# Patient Record
Sex: Female | Born: 1990 | Race: White | Hispanic: No | Marital: Single | Smoking: Never smoker
Health system: Southern US, Community
[De-identification: ages and names within clinical notes are randomized; demographics above are authoritative.]

## PROBLEM LIST (undated history)

## (undated) DIAGNOSIS — F419 Anxiety disorder, unspecified: Secondary | ICD-10-CM

## (undated) DIAGNOSIS — N946 Dysmenorrhea, unspecified: Secondary | ICD-10-CM

## (undated) DIAGNOSIS — R51 Headache: Secondary | ICD-10-CM

## (undated) DIAGNOSIS — N390 Urinary tract infection, site not specified: Secondary | ICD-10-CM

## (undated) DIAGNOSIS — R519 Headache, unspecified: Secondary | ICD-10-CM

## (undated) HISTORY — DX: Dysmenorrhea, unspecified: N94.6

## (undated) HISTORY — DX: Headache: R51

## (undated) HISTORY — PX: OTHER SURGICAL HISTORY: SHX169

## (undated) HISTORY — DX: Urinary tract infection, site not specified: N39.0

## (undated) HISTORY — DX: Anxiety disorder, unspecified: F41.9

## (undated) HISTORY — DX: Headache, unspecified: R51.9

---
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
## 2009-02-18 ENCOUNTER — Encounter: Payer: Self-pay | Admitting: Internal Medicine

## 2009-04-05 ENCOUNTER — Encounter: Payer: Self-pay | Admitting: Internal Medicine

## 2009-05-09 ENCOUNTER — Ambulatory Visit: Payer: Self-pay | Admitting: Family Medicine

## 2009-05-09 DIAGNOSIS — R300 Dysuria: Secondary | ICD-10-CM | POA: Insufficient documentation

## 2009-05-09 DIAGNOSIS — R21 Rash and other nonspecific skin eruption: Secondary | ICD-10-CM | POA: Insufficient documentation

## 2009-05-09 LAB — CONVERTED CEMR LAB
Bilirubin Urine: NEGATIVE
Glucose, Urine, Semiquant: NEGATIVE
Ketones, urine, test strip: NEGATIVE
Specific Gravity, Urine: 1.02
pH: 5.5

## 2009-06-04 ENCOUNTER — Ambulatory Visit: Payer: Self-pay | Admitting: Internal Medicine

## 2009-06-04 DIAGNOSIS — L908 Other atrophic disorders of skin: Secondary | ICD-10-CM | POA: Insufficient documentation

## 2009-06-04 DIAGNOSIS — L918 Other hypertrophic disorders of the skin: Secondary | ICD-10-CM

## 2009-07-04 ENCOUNTER — Encounter (INDEPENDENT_AMBULATORY_CARE_PROVIDER_SITE_OTHER): Payer: Self-pay | Admitting: *Deleted

## 2009-08-07 ENCOUNTER — Telehealth: Payer: Self-pay | Admitting: *Deleted

## 2009-08-16 ENCOUNTER — Ambulatory Visit: Payer: Self-pay | Admitting: Internal Medicine

## 2009-08-16 DIAGNOSIS — R599 Enlarged lymph nodes, unspecified: Secondary | ICD-10-CM | POA: Insufficient documentation

## 2009-08-16 DIAGNOSIS — L0889 Other specified local infections of the skin and subcutaneous tissue: Secondary | ICD-10-CM | POA: Insufficient documentation

## 2009-08-16 LAB — CONVERTED CEMR LAB
ALT: 15 units/L (ref 0–35)
AST: 21 units/L (ref 0–37)
Albumin: 4.1 g/dL (ref 3.5–5.2)
Alkaline Phosphatase: 48 units/L (ref 39–117)
Basophils Absolute: 0 10*3/uL (ref 0.0–0.1)
Calcium: 9.3 mg/dL (ref 8.4–10.5)
Cholesterol: 163 mg/dL (ref 0–200)
EBV NA IgG: 0.85
EBV VCA IgM: 0.11
Eosinophils Relative: 0.3 % (ref 0.0–5.0)
HDL: 64 mg/dL (ref >39)
Heterophile Ab Screen: NEGATIVE
MCV: 94.5 fL (ref 78.0–100.0)
Mono Screen: NEGATIVE
Monocytes Absolute: 1.3 10*3/uL — ABNORMAL HIGH (ref 0.1–1.0)
Monocytes Relative: 14.3 % — ABNORMAL HIGH (ref 3.0–12.0)
Neutrophils Relative %: 64.4 % (ref 43.0–77.0)
Platelets: 283 10*3/uL (ref 150.0–400.0)
RDW: 12.8 % (ref 11.5–14.6)
Rapid Strep: NEGATIVE
Sodium: 140 meq/L (ref 135–145)
TSH: 1.46 microintl units/mL (ref 0.35–5.50)
Total CHOL/HDL Ratio: 2.5
WBC: 9.3 10*3/uL (ref 4.5–10.5)

## 2009-08-17 ENCOUNTER — Encounter: Payer: Self-pay | Admitting: Internal Medicine

## 2009-08-19 ENCOUNTER — Telehealth: Payer: Self-pay | Admitting: *Deleted

## 2009-08-21 ENCOUNTER — Telehealth: Payer: Self-pay | Admitting: *Deleted

## 2009-08-22 ENCOUNTER — Ambulatory Visit: Payer: Self-pay | Admitting: Internal Medicine

## 2009-08-22 DIAGNOSIS — K121 Other forms of stomatitis: Secondary | ICD-10-CM | POA: Insufficient documentation

## 2009-08-22 DIAGNOSIS — K1230 Oral mucositis (ulcerative), unspecified: Secondary | ICD-10-CM | POA: Insufficient documentation

## 2010-07-01 ENCOUNTER — Telehealth: Payer: Self-pay | Admitting: Internal Medicine

## 2010-07-24 ENCOUNTER — Ambulatory Visit: Payer: Self-pay | Admitting: Family Medicine

## 2010-07-24 DIAGNOSIS — J029 Acute pharyngitis, unspecified: Secondary | ICD-10-CM | POA: Insufficient documentation

## 2010-07-24 LAB — CONVERTED CEMR LAB: Rapid Strep: POSITIVE

## 2010-09-03 ENCOUNTER — Other Ambulatory Visit: Payer: Self-pay | Admitting: *Deleted

## 2010-09-03 DIAGNOSIS — Z309 Encounter for contraceptive management, unspecified: Secondary | ICD-10-CM

## 2010-09-03 MED ORDER — NORETHIN ACE-ETH ESTRAD-FE 1-20 MG-MCG PO TABS: 1.0000 | ORAL_TABLET | Freq: Every day | ORAL | Status: DC

## 2010-09-03 NOTE — Telephone Encounter (Signed)
Rx sent to pharmacy via fax. Sent letter to pt to call back to make a follow up appt. Before next refill

## 2010-09-04 NOTE — Progress Notes (Signed)
Summary: REFILL ON OCPS  Phone Note From Pharmacy   Caller: [facility]* Reason for Call: Needs renewal Details for Reason: Loestrin  Initial call taken by: Romualdo Bolk, CMA (AAMA),  August 07, 2009 1:48 PM    Prescriptions: LOESTRIN 24 FE 1-20 MG-MCG TABS (NORETHIN ACE-ETH ESTRAD-FE) 1 by mouth once daily  #1 x 0   Entered by:   Romualdo Bolk, CMA (AAMA)   Authorized by:   Madelin Headings MD   Signed by:   Romualdo Bolk, CMA (AAMA) on 08/07/2009   Method used:   Electronically to        [facility]* (retail)       [address] [place], [place]  [address]       Ph: [phone] or [phone]       Fax: [phone]   RxID:   [id number]

## 2010-09-04 NOTE — Assessment & Plan Note (Signed)
Summary: knot on side of throat//-? pap//ccm   Vital Signs:  Patient profile:   20 year old female Menstrual status:  regular LMP:     08/09/2009 Weight:      105 pounds Temp:     98.3 degrees F oral Pulse rate:   72 / minute BP sitting:   120 / 80  (right arm) Cuff size:   regular  Vitals Entered By: Romualdo Bolk, CMA (AAMA) (August 16, 2009 8:39 AM) CC: Pt wants to change her ocps and get a refill due to cost. Pt also has a cold, coughing and congestion x 3 weeks and knots on the left side of her neck x 2 days. Pt states that her left ear is sore and itching.  LMP (date): 08/09/2009 LMP - Character: normal Menarche (age onset years): unsure   Menses interval (days): 28 Menstrual flow (days): 4-5 Enter LMP: 08/09/2009 Last PAP Result NEGATIVE FOR INTRAEPITHELIAL LESIONS OR MALIGNANCY.   History of Present Illness: Michelle Patrick comesin today for    follow up of OCPs  and other issues. Since last visit  here  there have been no major changes in health status  however has a recent illness. #OCPs  : No concerns   3-4  days  of menses . too expensive and would like to try another option.   #HAs   not that bad anymore.  Although  getting  with period.    takes prn  excedrin as rescue. Sees Dr Sharene Skeans for suppressive meds.  #Breast  lumps : [place] shoes fibroadenoma left breast 3 small one 3 cm . no sig changes . rec routine follow up by radiographer.  NO change per patient  New problem: Lump on left neck for acouple days that is very tender . She also has left mandibular   gum   tender    no fever . She  has been sick for a while with a head cold  nose congestion  for 3 weeks  but is  getting better.    minor  cough  , taking  afrin and dayquil.      No real sore thriat with this . No NVD. no weight loss.   has never had mono. no strep exposure.   Preventive Screening-Counseling & Management  Alcohol-Tobacco     Alcohol drinks/day: 0     Smoking Status:  never  Caffeine-Diet-Exercise     Caffeine use/day: less than 1     Does Patient Exercise: yes  Current Medications (verified): 1)  Loestrin 24 Fe 1-20 Mg-Mcg Tabs (Norethin Ace-Eth Estrad-Fe) .Marland Kitchen.. 1 By Mouth Once Daily 2)  Relpax 40 Mg Tabs (Eletriptan Hydrobromide) .... By Mouth For Headache Per Dr Elizebeth Koller 3)  Topamax 50 Mg Tabs (Topiramate) .Marland Kitchen.. 1 By Mouth Once Daily 4)  Paroxetine Hcl 12.5 Mg Xr24h-Tab (Paroxetine Hcl) .Marland Kitchen.. 1 By Mouth Once Daily  Allergies (verified): No Known Drug Allergies  Past History:  Past medical, surgical, family and social histories (including risk factors) reviewed, and no changes noted (except as noted below).  Past Medical History: Reviewed history from 06/04/2009 and no changes required. dysmennorhea pap7/08 UTI  recurrent Headaches  anxiety       LAST Pap: 2009 Td: 01/27/2002 Smoking: never LMP: 3 weeks ago Consults: HA   Hickling  Past History:  Care Management: Neurology: [facility] Dermatology: unsure of name  Family History: Reviewed history from 08/28/2008 and no changes required. no clots,no cvd no family hx of breast cancer .  Social History: Reviewed history from 08/28/2008 and no changes required. non smoker  lives at home foes to college  [employer] [facility] [employer] of 4  both parents smoke but not around her pet dogs and cats     Review of Systems  The patient denies anorexia, fever, weight loss, weight gain, vision loss, decreased hearing, hoarseness, chest pain, syncope, dyspnea on exertion, peripheral edema, prolonged cough, hemoptysis, abdominal pain, melena, hematochezia, severe indigestion/heartburn, hematuria, incontinence, difficulty walking, depression, unusual weight change, abnormal bleeding, and angioedema.    Physical Exam  General:  Well-developed,well-nourished,in no acute distress; alert,appropriate and cooperative throughout examination Head:  Normocephalic and atraumatic without obvious abnormalities. No apparent  alopecia or balding. Eyes:  PERRL, EOMs full, conjunctiva clear  Ears:  R ear normal, L ear normal, and no external deformities.   Nose:  no external deformity, no external erythema, and no nasal discharge.   Mouth:  left perleche  tonsil 1 + no edema and no exudate.  left mandibular gum over 3rd molar area is red and swollen  no ulcer  no bleeding.  Neck:  left ac node 2 +  right 1+  slight increase of pc nodes  Chest Wall:  No deformities, masses, or tenderness noted. Breasts:  skin/areolae normal.  left 2.5 cm mobile lump about 5 ocock non tender  otherwise lumpy.  Lungs:  Normal respiratory effort, chest expands symmetrically. Lungs are clear to auscultation, no crackles or wheezes.no dullness.   Heart:  Normal rate and regular rhythm. S1 and S2 normal without gallop, murmur, click, rub or other extra sounds.no lifts.   Abdomen:  Bowel sounds positive,abdomen soft and non-tender without masses, organomegaly or hernias noted. Msk:  no joint swelling, no joint warmth, and no redness over joints.   Pulses:  pulses intact without delay   Extremities:  no clubbing cyanosis or edema  Neurologic:  alert & oriented X3, strength normal in all extremities, gait normal, and DTRs symmetrical and normal.   Skin:  turgor normal, color normal, no ecchymoses, no petechiae, and no purpura.   Cervical Nodes:  see neck exam R anterior LN tender and L anterior LN tender.   Axillary Nodes:  No palpable lymphadenopathy Inguinal Nodes:  No significant adenopathy Psych:  Oriented X3, good eye contact, not anxious appearing, and not depressed appearing.     Impression & Recommendations:  Problem # 1:  LYMPHADENOPATHY (ICD-785.6) Assessment New Apprears infection reaction consider mono strep   cx pending    given rx incase worse over weekend and can disc with on call physc.    Her updated medication list for this problem includes:    Penicillin V Potassium 500 Mg Tabs (Penicillin v potassium) .Marland Kitchen... 1 by mouth  three times a day  Orders: TLB-CBC Platelet - w/Differential (85025-CBCD) TLB-TSH (Thyroid Stimulating Hormone) (84443-TSH) T-HIV Antibody  (Reflex) [id number]) T-RPR (Syphilis) [phone]) TLB-Mono Test (Monospot) [phone]) T- * Misc. Laboratory test [phone]) Rapid Strep (28413) Venipuncture (24401) Specimen Handling (02725) T-Culture, Throat (36644-03474) T- * Misc. 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  cost an issues  . trila of loestrin 1/20  ocp ...  monitor  HAs    Problem # 4:  HEADACHE (ICD-784.0) Assessment: Unchanged  Her updated medication list for this problem includes:    Relpax 40 Mg Tabs (Eletriptan hydrobromide) ..... By mouth for headache  per dr hicling  Problem # 5:  SCREENING EXAMINATION FOR VENEREAL DISEASE (ICD-V74.5) some risk no symptom   counseled  Orders: T-Chlamydia & GC Probe, Urine ([id number]) Venipuncture (04540) Specimen Handling (98119) T-Culture, Throat ([id number])  Complete Medication List: 1)  Loestrin 24 Fe 1-20 Mg-mcg Tabs (Norethin ace-eth estrad-fe) .Marland Kitchen.. 1 by mouth once daily 2)  Relpax 40 Mg Tabs (Eletriptan hydrobromide) .... By mouth for headache per dr hicling 3)  Topamax 50 Mg Tabs (Topiramate) .Marland Kitchen.. 1 by mouth once daily 4)  Paroxetine Hcl 12.5 Mg Xr24h-tab (Paroxetine hcl) .Marland Kitchen.. 1 by mouth once daily 5)  Junel 1/20 1-20 Mg-mcg Tabs (Norethindrone acet-ethinyl est) .Marland Kitchen.. 1 by mouth once daily 6)  Penicillin V Potassium 500 Mg Tabs (Penicillin v potassium) .Marland Kitchen.. 1 by mouth three times a day  Patient Instructions: 1)  You will be informed of lab results when available.  2)  Change OCps and monitor your headaches 3)  Need to check breast exam every 6-12 months  consider having larger  fibroadenoma removed if getting bigger. 4)  You have a perlech e on lip  use  otc lotrimin or monistat crem and polysporin and keep moist.   Peroxyl rinses for swollen gum area. 5)  May need to see  dentist if continuing.     Prescriptions: PENICILLIN V POTASSIUM 500 MG TABS (PENICILLIN V POTASSIUM) 1 by mouth three times a day  #30 x 0   Entered and Authorized by:   Madelin Headings MD   Signed by:   Madelin Headings MD on 08/16/2009   Method used:   Print then Give to Patient   RxID:   [id number] JUNEL 1/20 1-20 MG-MCG TABS (NORETHINDRONE ACET-ETHINYL EST) 1 by mouth once daily  #1 pack x 12   Entered and Authorized by:   Madelin Headings MD   Signed by:   Madelin Headings MD on 08/16/2009   Method used:   Electronically to        [facility]* (retail)       [address] [place], [place]  [address]       Ph: [phone] or [phone]       Fax: [phone]   RxID:   [id number]   Laboratory Results   Blood Tests      Mono: negative Comments: Joanne Chars CMA  August 16, 2009 9:57 AM    Other Tests  Rapid Strep: negative Comments: Joanne Chars CMA  August 16, 2009 9:57 AM

## 2010-09-04 NOTE — Assessment & Plan Note (Signed)
Summary: fever, sore throat/dm   Vital Signs:  Patient profile:   20 year old female Menstrual status:  regular Temp:     98.3 degrees F oral BP sitting:   120 / 80  (left arm) Cuff size:   regular  Vitals Entered By: Sid Falcon LPN (July 24, 2010 10:35 AM)  History of Present Illness: Sore throat.  Sore throat is mild severity.  One day duration. Mild nausea with no vomiting.  Diffuse body aches. Mild headache.  No nasal congestion or cough. Teaches daycare.  No rashes.  Allergies (verified): No Known Drug Allergies  Past History:  Past Medical History: Last updated: 06/04/2009 dysmennorhea pap7/08 UTI  recurrent Headaches  anxiety       LAST Pap: 2009 Td: 01/27/2002 Smoking: never LMP: 3 weeks ago Consults: HA   Hickling PMH reviewed for relevance  Review of Systems      See HPI  Physical Exam  General:  Well-developed,well-nourished,in no acute distress; alert,appropriate and cooperative throughout examination Head:  Normocephalic and atraumatic without obvious abnormalities. No apparent alopecia or balding. Ears:  External ear exam shows no significant lesions or deformities.  Otoscopic examination reveals clear canals, tympanic membranes are intact bilaterally without bulging, retraction, inflammation or discharge. Hearing is grossly normal bilaterally. Mouth:  post pharynx erythema.  No exudate Neck:  supple with tender ac nodes. Lungs:  Normal respiratory effort, chest expands symmetrically. Lungs are clear to auscultation, no crackles or wheezes. Heart:  normal rate and regular rhythm.   Skin:  no rashes.     Impression & Recommendations:  Problem # 1:  SORE THROAT (ICD-462) pos Strep .  PCN V 500 mg three times a day for 10 days Her updated medication list for this problem includes:    Penicillin V Potassium 500 Mg Tabs (Penicillin v potassium) .Marland Kitchen... 1 by mouth three times a day  Orders: Rapid Strep (40981)  Complete Medication List: 1)   Loestrin 24 Fe 1-20 Mg-mcg Tabs (Norethin ace-eth estrad-fe) .Marland Kitchen.. 1 by mouth once daily 2)  Relpax 40 Mg Tabs (Eletriptan hydrobromide) .... By mouth for headache per dr hicling 3)  Topamax 50 Mg Tabs (Topiramate) .Marland Kitchen.. 1 by mouth once daily 4)  Paroxetine Hcl 12.5 Mg Xr24h-tab (Paroxetine hcl) .Marland Kitchen.. 1 by mouth once daily 5)  Junel 1/20 1-20 Mg-mcg Tabs (Norethindrone acet-ethinyl est) .Marland Kitchen.. 1 by mouth once daily 6)  Penicillin V Potassium 500 Mg Tabs (Penicillin v potassium) .Marland Kitchen.. 1 by mouth three times a day 7)  Valtrex 500 Mg Tabs (Valacyclovir hcl) .... 2 two times a day 8)  Zovirax 5 % Oint (Acyclovir) .... Apply 3 grams six times a day for 7 days. 9)  Valacyclovir Hcl 500 Mg Tabs (Valacyclovir hcl) .... 2 by mouth three times a day  or as directed 10)  Magic Mouthwash  .... Swish and spit 5 cc q 3-4 hours as needed for mouth ulcers  Patient Instructions: 1)  Take your antibiotic as prescribed until ALL of it is gone, but stop if you develop a rash or swelling and contact our office as soon as possible.  Prescriptions: PENICILLIN V POTASSIUM 500 MG TABS (PENICILLIN V POTASSIUM) 1 by mouth three times a day  #30 x 0   Entered and Authorized by:   Evelena Peat MD   Signed by:   Evelena Peat MD on 07/24/2010   Method used:   Electronically to        [facility]* (retail)  [address] [place], [place]  [address]       Ph: [phone] or [phone]       Fax: [phone]   RxID:   [id number]    Orders Added: 1)  Rapid Strep [27253] 2)  Est. Patient Level III [66440]    Laboratory Results    Other Tests  Rapid Strep: positive Comments: Rita Ohara  July 24, 2010 10:42 AM   Kit Test Internal QC: Positive   (Normal Range: Negative)

## 2010-09-04 NOTE — Progress Notes (Signed)
Summary: REQ FOR RX  Phone Note Call from Patient   Caller: Mom Summary of Call: Pts mom called back to see if lab results were ready.... Pts mom was adv that pt would be contacted once results were in, not all test results were back yet.... Pts mom adv that sores in her daughters mouth are getting worse and wants to know if RX that could be called in to help alieviate symptoms... If so, same can be called into [facility].  Mom / pt can be reached @ [phone] with any questions or concerns.  Initial call taken by: Debbra Riding,  August 19, 2009 12:14 PM  Follow-up for Phone Call        Spoke to mom and she called the denstist and they called in something for her for herpes of the mouth. Mom wants to hold off on mouth rinse for now. Follow-up by: Romualdo Bolk, CMA (AAMA),  August 19, 2009 3:17 PM

## 2010-09-04 NOTE — Progress Notes (Signed)
Summary: refill on valacyclovir  Phone Note From Pharmacy   Caller: [facility] Reason for Call: Needs renewal Details for Reason: valacyclovir 500mg  Summary of Call: Pt takes 2 tab by mouth three times a day or as directed #36 last filled on 08/24/09 Initial call taken by: Romualdo Bolk, CMA (AAMA),  July 01, 2010 4:57 PM  Follow-up for Phone Call        ok x 1  Follow-up by: Madelin Headings MD,  July 01, 2010 6:55 PM    New/Updated Medications: VALACYCLOVIR HCL 500 MG TABS (VALACYCLOVIR HCL) 2 by mouth three times a day  or as directed Prescriptions: VALACYCLOVIR HCL 500 MG TABS (VALACYCLOVIR HCL) 2 by mouth three times a day  or as directed  #36 x 0   Entered by:   Lynann Beaver CMA AAMA   Authorized by:   Madelin Headings MD   Signed by:   Lynann Beaver CMA AAMA on 07/02/2010   Method used:   Electronically to        [facility]* (retail)       [address] [place], [place]  [address]       Ph: [phone] or [phone]       Fax: [phone]   RxID:   [id number]

## 2010-09-04 NOTE — Progress Notes (Signed)
Summary: Pts mom called req lab results  Phone Note Call from Patient Call back at [phone] [facility] cell   Caller: mom- Dois Davenport Reason for Call: Lab or Test Results Summary of Call: Pts mom called req lab results. Please call asap.  Initial call taken by: Lucy Antigua,  August 21, 2009 10:49 AM  Follow-up for Phone Call        the  lab tests show that she has had mono infection but cant tell when  .   How is she doing?Marland Kitchen  Follow-up by: Madelin Headings MD,  August 21, 2009 12:59 PM  Additional Follow-up for Phone Call Additional follow up Details #1::        Mom aware of results. She has been running a fever off and on. She has got sores in the mouth. Dentist has called in Valtrex. She just started it 1/17. Temp as around 102-103. Mom is wondering if there is anything else that can be done. Pt is in alot of pain. Pt was also exposed to hand, foot and mouth. Pt is wanting to know if she needs to continue on the amoxil? Additional Follow-up by: Romualdo Bolk, CMA Duncan Dull),  August 21, 2009 2:16 PM    Additional Follow-up for Phone Call Additional follow up Details #2::    amoxicillin is porbably nont helping her as her strep is negative and shedeveloped fever on this.  THis could be viral .    she needs ROV   to assess further  if her fever is persisting.   Additional Follow-up for Phone Call Additional follow up Details #3:: Details for Additional Follow-up Action Taken: Pt to come in am and will d/c amoxil. Additional Follow-up by: Romualdo Bolk, CMA (AAMA),  August 21, 2009 5:31 PM

## 2010-09-04 NOTE — Assessment & Plan Note (Signed)
Summary: fever/ssc   Vital Signs:  Patient profile:   20 year old female Menstrual status:  regular Weight:      104 pounds Temp:     98.1 degrees F oral Pulse rate:   88 / minute BP sitting:   120 / 80  (right arm) Cuff size:   regular  Vitals Entered By: Romualdo Bolk, CMA (AAMA) (August 22, 2009 9:03 AM) CC: Fever has finally broke, valtrex is helping with sores. But mouth is still hurting her.   History of Present Illness: Michelle Patrick comesin today for  continuing illness and fever .  Since last visti sore in mouth got worse and then got fever 102 range and increasing sores in mouth. Her dentist  saw her and  began medication .  on for 3 days   1/17 topical  acyclovir and    Valtrex 1000 two times a day    .  Fever   lasted a while    "    no fever last  night ".    No NVD .   Loose stool  poss related to decrease eating and change in diet. .   hurts to eat with sores on tongue lip and gum left mostly.  things sting. Denies dehydratino however. . NO other rashes.  Stated she has had minor cold sores in the past and no rx.  nover had anything like this. ? if could be HFM disease.    NO cough.  Preventive Screening-Counseling & Management  Alcohol-Tobacco     Alcohol drinks/day: 0     Smoking Status: never  Caffeine-Diet-Exercise     Caffeine use/day: less than 1     Does Patient Exercise: yes  Current Medications (verified): 1)  Loestrin 24 Fe 1-20 Mg-Mcg Tabs (Norethin Ace-Eth Estrad-Fe) .Marland Kitchen.. 1 By Mouth Once Daily 2)  Relpax 40 Mg Tabs (Eletriptan Hydrobromide) .... By Mouth For Headache Per Dr Elizebeth Koller 3)  Topamax 50 Mg Tabs (Topiramate) .Marland Kitchen.. 1 By Mouth Once Daily 4)  Paroxetine Hcl 12.5 Mg Xr24h-Tab (Paroxetine Hcl) .Marland Kitchen.. 1 By Mouth Once Daily 5)  Junel 1/20 1-20 Mg-Mcg Tabs (Norethindrone Acet-Ethinyl Est) .Marland Kitchen.. 1 By Mouth Once Daily 6)  Penicillin V Potassium 500 Mg Tabs (Penicillin V Potassium) .Marland Kitchen.. 1 By Mouth Three Times A Day 7)  Valtrex 500 Mg Tabs  (Valacyclovir Hcl) .... 2 Two Times A Day 8)  Zovirax 5 % Oint (Acyclovir) .... Apply 3 Grams Six Times A Day For 7 Days.  Allergies (verified): No Known Drug Allergies  Past History:  Past medical, surgical, family and social histories (including risk factors) reviewed, and no changes noted (except as noted below).  Past Medical History: Reviewed history from 06/04/2009 and no changes required. dysmennorhea pap7/08 UTI  recurrent Headaches  anxiety       LAST Pap: 2009 Td: 01/27/2002 Smoking: never LMP: 3 weeks ago Consults: HA   Hickling  Family History: Reviewed history from 08/28/2008 and no changes required. no clots,no cvd no family hx of breast cancer .      Social History: Reviewed history from 08/16/2009 and no changes required. non smoker  lives at home foes to college  [employer] of 4  both parents smoke but not around her pet dogs and cats     Review of Systems       The patient complains of anorexia and fever.  The patient denies hoarseness, chest pain, syncope, dyspnea on exertion, peripheral edema, prolonged cough, hematochezia, severe indigestion/heartburn,  hematuria, muscle weakness, difficulty walking, unusual weight change, abnormal bleeding, and angioedema.    Physical Exam  General:  alert, well-developed, and well-nourished.  ulcers left lip  area  Head:  normocephalic and atraumatic.   Eyes:  clear   no discharge  Ears:  R ear normal and L ear normal.   Nose:  no nasal discharge.   Mouth:  good dentition.  small sores on tongue OP looks ok minimally red .  perleche still presnt left  and some ll lip edema     Neck:  No deformities, masses, excep tfor left ac nodes  no pc nodes  Lungs:  normal respiratory effort and no intercostal retractions.   Heart:  normal rate and regular rhythm.   Abdomen:  Bowel sounds positive,abdomen soft and non-tender without masses, organomegaly or  s noted. Pulses:  nl cap refill  Extremities:  no clubbing cyanosis or  edema  Neurologic:  alert & oriented X3 and gait normal.   Skin:  turgor normal, color normal, no ecchymoses, and no petechiae.  pustular ulcerative lesion left lower lip   no extremity rash Cervical Nodes:  R anterior LN tender and L anterior LN tender.  smaller than last exam   no pc nodes  Axillary Nodes:  No palpable lymphadenopathy Inguinal Nodes:  No significant adenopathy Psych:  Oriented X3, good eye contact, and not anxious appearing.   Additional Exam:  reviewed labs and copy to patient   Impression & Recommendations:  Problem # 1:  STOMATITIS AND MUCOSITIS UNSPECIFIED (ICD-528.00) prob  HSV     got worse on amoxicillin  ; however,      she states she gets cold sores in the past and this is unusal presentation for the severity of illness for   recurrence.   No evidence other wise of HM.   warned of dehydration risk.   continue antivirals and add Magic MW .   She has had mono by serology but timing unknown by tests.    Complete Medication List: 1)  Loestrin 24 Fe 1-20 Mg-mcg Tabs (Norethin ace-eth estrad-fe) .Marland Kitchen.. 1 by mouth once daily 2)  Relpax 40 Mg Tabs (Eletriptan hydrobromide) .... By mouth for headache per dr hicling 3)  Topamax 50 Mg Tabs (Topiramate) .Marland Kitchen.. 1 by mouth once daily 4)  Paroxetine Hcl 12.5 Mg Xr24h-tab (Paroxetine hcl) .Marland Kitchen.. 1 by mouth once daily 5)  Junel 1/20 1-20 Mg-mcg Tabs (Norethindrone acet-ethinyl est) .Marland Kitchen.. 1 by mouth once daily 6)  Penicillin V Potassium 500 Mg Tabs (Penicillin v potassium) .Marland Kitchen.. 1 by mouth three times a day 7)  Valtrex 500 Mg Tabs (Valacyclovir hcl) .... 2 two times a day 8)  Zovirax 5 % Oint (Acyclovir) .... Apply 3 grams six times a day for 7 days. 9)  Valacyclovir Hcl 500 Mg Tabs (Valacyclovir hcl) .... 2 by mouth three times a day  or as directed 10)  Magic Mouthwash  .... Swish and spit 5 cc q 3-4 hours as needed for mouth ulcers  Patient Instructions: 1)  this acts like a viral  infection like  herpes simplex  the cold sore  virus. 2)  increast the valtrex to 1000 mg three times a day  3)  add magic mouth wash for pain .  4)  Avoid dehydration . 5)  expect fever to be resolved and much better  after the weekend . 6)  No other abnormal findings on you exam today .  Prescriptions: MAGIC MOUTHWASH swish and spit  5 cc q 3-4 hours as needed for mouth ulcers  #10 oz x 1   Entered and Authorized by:   Madelin Headings MD   Signed by:   Madelin Headings MD on 08/22/2009   Method used:   Print then Give to Patient   RxID:   [id number] VALACYCLOVIR HCL 500 MG TABS (VALACYCLOVIR HCL) 2 by mouth three times a day  or as directed  #36 x 0   Entered and Authorized by:   Madelin Headings MD   Signed by:   Madelin Headings MD on 08/22/2009   Method used:   Print then Give to Patient   RxID:   [id number]

## 2010-09-04 NOTE — Progress Notes (Signed)
Summary: REQ FOR LAB RESULTS  Phone Note Call from Patient   Caller: Patient [phone]) Reason for Call: Talk to Nurse Summary of Call: Pts mom called...Marland KitchenMarland Kitchen pt would like to get results from bloodwork that was done last week.... Pt can be reached @ [phone] - per mom .  Initial call taken by: Debbra Riding,  August 19, 2009 8:23 AM  Follow-up for Phone Call        so far  labs are normal  But not all of them are back. Throat culture is negative for strep. Mono serologies are not back yet but the quick test was negative.  Will contact when these results are back. Follow-up by: Madelin Headings MD,  August 19, 2009 9:24 AM  Additional Follow-up for Phone Call Additional follow up Details #1::        MOM CB, MOM IS AWARE SOMEONE WILL CB Additional Follow-up by: Heron Sabins,  August 19, 2009 10:44 AM    Additional Follow-up for Phone Call Additional follow up Details #2::    Mom aware of results and I told her that we would call her once we got all the results back. Follow-up by: Romualdo Bolk, CMA Duncan Dull),  August 19, 2009 3:18 PM

## 2010-10-01 ENCOUNTER — Telehealth: Payer: Self-pay | Admitting: *Deleted

## 2010-10-01 DIAGNOSIS — Z309 Encounter for contraceptive management, unspecified: Secondary | ICD-10-CM

## 2010-10-01 MED ORDER — NORETHIN ACE-ETH ESTRAD-FE 1-20 MG-MCG PO TABS: 1.0000 | ORAL_TABLET | Freq: Every day | ORAL | Status: DC

## 2010-10-16 ENCOUNTER — Encounter: Payer: Self-pay | Admitting: Internal Medicine

## 2010-10-22 ENCOUNTER — Ambulatory Visit (INDEPENDENT_AMBULATORY_CARE_PROVIDER_SITE_OTHER): Payer: 59 | Admitting: Internal Medicine

## 2010-10-22 ENCOUNTER — Encounter: Payer: Self-pay | Admitting: Internal Medicine

## 2010-10-22 VITALS — BP 120/80 | HR 60 | Ht 62.0 in | Wt 109.0 lb

## 2010-10-22 DIAGNOSIS — Z113 Encounter for screening for infections with a predominantly sexual mode of transmission: Secondary | ICD-10-CM

## 2010-10-22 DIAGNOSIS — Z0000 Encounter for general adult medical examination without abnormal findings: Secondary | ICD-10-CM

## 2010-10-22 DIAGNOSIS — D249 Benign neoplasm of unspecified breast: Secondary | ICD-10-CM

## 2010-10-22 DIAGNOSIS — Z309 Encounter for contraceptive management, unspecified: Secondary | ICD-10-CM

## 2010-10-22 MED ORDER — NORETHIN ACE-ETH ESTRAD-FE 1-20 MG-MCG PO TABS: 1.0000 | ORAL_TABLET | Freq: Every day | ORAL | Status: DC

## 2010-10-22 NOTE — Assessment & Plan Note (Signed)
Follow  Clinically still present no change    If get larger may suggest consult for removal.

## 2010-10-22 NOTE — Progress Notes (Signed)
  Subjective:    Patient ID: Michelle Patrick, female    DOB: [birth year], 20 y.o.   MRN: [id number]  HPI  patient comes in for a check up and med refill.  Since her last visit she has had no major changes in her health status. She is on the 28 day pill of the Loestrin 120 and she feels she is doing well with periods 4-5 days and no significant cramps. Needs refills.  Past Medical History  Diagnosis Date  . Dysmenorrhea     on ocps   . Recurrent UTI   . Anxiety   . Generalized headaches     has seen Dr Sharene Skeans    History reviewed. No pertinent past surgical history.  reports that she has never smoked. She does not have any smokeless tobacco history on file. She reports that she does not drink alcohol or use illicit drugs. family history is not on file. No Known Allergies   Review of Systems  No change she believes in the lumps in her left breast her last ultrasound was 2010 and was told to get routine followup at age 66.   No chest pain shortness of breath major orthopedic problems neuro GI/GU are normal.  Monogamous relationship almost 2 years    Objective:   Physical Exam Physical Exam: Vital signs reviewed NWG:[id number] is a well-developed well-nourished alert cooperative  white female who appears her stated age in no acute distress.  HEENT: normocephalic  traumatic , Eyes: PERRL EOM's full, conjunctiva clear, Nares: paten,t no deformity discharge or tenderness., Ears: no deformity EAC's clear TMs with normal landmarks. Mouth: clear OP, no lesions, edema.  Moist mucous membranes. Dentition in adequate repair. NECK: supple without masses, thyromegaly or bruits. CHEST/PULM:  Clear to auscultation and percussion breath sounds equal no wheeze , rales or rhonchi. No chest wall deformities or tenderness. Breast: normal by inspection . No dimpling, discharge, , tenderness or discharge . The left breast has 3 separate nodules less than 12 and 5:00 the largest is that the 5:00 about 2-3 cm  mobile smooth nontender. Axilla is clear these areas correlate with her last ultrasound report from 2010 which was reviewed today  CV: PMI is nondisplaced, S1 S2 no gallops, murmurs, rubs. Peripheral pulses are full without delay.No JVD .  ABDOMEN: Bowel sounds normal nontender  No guard or rebound, no hepato splenomegal no CVA tenderness.  No hernia. Extremtities:  No clubbing cyanosis or edema, no acute joint swelling or redness no focal atrophy NEURO:  Oriented x3, cranial nerves 3-12 appear to be intact, no obvious focal weakness,gait within normal limits no abnormal reflexes or asymmetrical SKIN: No acute rashes normal turgor, color, no bruising or petechiae.  Tattoos noted PSYCH: Oriented, good eye contact, no obvious depression anxiety, cognition and judgment appear normal.       Assessment & Plan:   preventive visit well women exam  Up to date discussed in continue healthy lifestyle   Screening tests reviewed today.  Contraceptive management;  Continue same medication for now and recheck in a year.  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 Discussed with patient we will follow these and if they are getting bigger she may need to be referred for removal of the largest.  Headaches   Seem to be improved on controller medication has seen Dr. Sharene Skeans in the past.

## 2010-10-22 NOTE — Patient Instructions (Signed)
Will notify you  of labs when available. Continue  hormonal pills . Check up in a year   Will do pap then .Marland Kitchen Call if breast lumps are changing

## 2010-10-23 LAB — CBC WITH DIFFERENTIAL/PLATELET
Basophils Absolute: 0 10*3/uL (ref 0.0–0.1)
Basophils Relative: 0.4 % (ref 0.0–3.0)
Eosinophils Absolute: 0.1 10*3/uL (ref 0.0–0.7)
Hemoglobin: 13.3 g/dL (ref 12.0–15.0)
Lymphocytes Relative: 40.9 % (ref 12.0–46.0)
MCHC: 34 g/dL (ref 30.0–36.0)
MCV: 93.8 fl (ref 78.0–100.0)
Monocytes Absolute: 0.5 10*3/uL (ref 0.1–1.0)
Neutro Abs: 3.8 10*3/uL (ref 1.4–7.7)
Neutrophils Relative %: 50.8 % (ref 43.0–77.0)
RBC: 4.16 Mil/uL (ref 3.87–5.11)
RDW: 13.3 % (ref 11.5–14.6)

## 2010-10-23 LAB — HIV ANTIBODY (ROUTINE TESTING W REFLEX): HIV: NONREACTIVE

## 2010-10-23 LAB — CHLAMYDIA PROBE AMPLIFICATION, URINE: Chlamydia, Swab/Urine, PCR: NEGATIVE

## 2010-10-23 NOTE — Progress Notes (Signed)
Addended by: Rita Ohara on: 10/23/2010 09:21 AM   Modules accepted: Orders

## 2010-10-24 ENCOUNTER — Encounter: Payer: Self-pay | Admitting: *Deleted

## 2010-11-04 ENCOUNTER — Encounter: Payer: Self-pay | Admitting: Internal Medicine

## 2010-11-04 ENCOUNTER — Ambulatory Visit (INDEPENDENT_AMBULATORY_CARE_PROVIDER_SITE_OTHER): Payer: 59 | Admitting: Internal Medicine

## 2010-11-04 VITALS — BP 110/70 | HR 66 | Temp 98.0°F | Wt 105.0 lb

## 2010-11-04 DIAGNOSIS — H9209 Otalgia, unspecified ear: Secondary | ICD-10-CM

## 2010-11-04 DIAGNOSIS — J069 Acute upper respiratory infection, unspecified: Secondary | ICD-10-CM

## 2010-11-04 DIAGNOSIS — J029 Acute pharyngitis, unspecified: Secondary | ICD-10-CM

## 2010-11-04 NOTE — Progress Notes (Signed)
  Subjective:    Patient ID: Michelle Patrick, female    DOB: 11/12/1990, 20 y.o.   MRN: [id number]  HPI Patient comes in today with 2 day history of above symptoms. She has somewhat of a sore throat to swallow some left ear discomfort and a mild dry cough.  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 She denies any fever nausea or vomiting or unusual rashes.   Review of Systems No chest pain shortness of breath and wheezing there is some itchiness to her left ear canal no discharge.    Objective:   Physical Exam Well-developed well-nourished in no acute distress HEENT: Normocephalic ;atraumatic , Eyes;  PERRL, EOMs  Full, lids and conjunctiva clear,,Ears: piercing  no deformities, canals nl, TM landmarks normal, Nose: no deformity or discharge  Mild congestion Mouth : OP clear without lesion or edema  There is redness at both tonsillar areas no exudate  Neck supple without masses  Shoddy nodes left  Chest:  Clear to A&P without wheezes rales or rhonchi CV:  S1-S2 no gallops or murmurs peripheral perfusion is normal Skin no acute changes. RS negative ,culture ordered    Assessment & Plan:   Pharyngitis  Referred to ear .  Prob viral   R/o strep  With past  Hx of same  And day care exposures.    Expectant management.   Call with alarm features.

## 2010-11-04 NOTE — Patient Instructions (Addendum)
Your rapid strep test is negative  This is probably a viral respiratory infection and your cough could get worse  Before it gets better. Call if not improved  in a week or so or if fever severe symptoms shortness of breath. Will cal about culture results

## 2010-11-06 LAB — THROAT CULTURE

## 2010-11-10 NOTE — Progress Notes (Signed)
Left message with mom about results.

## 2011-02-19 NOTE — Telephone Encounter (Signed)
rx taken care of 

## 2011-10-02 ENCOUNTER — Telehealth: Payer: Self-pay | Admitting: Internal Medicine

## 2011-10-02 DIAGNOSIS — Z309 Encounter for contraceptive management, unspecified: Secondary | ICD-10-CM

## 2011-10-02 MED ORDER — NORETHIN ACE-ETH ESTRAD-FE 1-20 MG-MCG PO TABS: 1.0000 | ORAL_TABLET | Freq: Every day | ORAL | Status: DC

## 2011-10-02 NOTE — Telephone Encounter (Signed)
Patient's mom is calling stating that the patient needs a refill on her birth control due to be taken on Sunday. She would like to have it called in today at [facility]. Please assist.

## 2011-10-02 NOTE — Telephone Encounter (Signed)
Left message on machine that pt needs to schedule a physical appt before next refill.

## 2011-10-09 ENCOUNTER — Telehealth: Payer: Self-pay | Admitting: Internal Medicine

## 2011-10-09 MED ORDER — VALACYCLOVIR HCL 500 MG PO TABS: 1000.0000 mg | ORAL_TABLET | Freq: Two times a day (BID) | ORAL | Status: DC

## 2011-10-09 NOTE — Telephone Encounter (Signed)
(  VALTREX) 500 MG tablet( generic brand) - said last time the 30mg  worked better. Want to get refill?

## 2011-10-09 NOTE — Telephone Encounter (Signed)
Spoke to mom and pt has cold sores.

## 2011-10-12 ENCOUNTER — Ambulatory Visit: Payer: 59 | Admitting: Internal Medicine

## 2011-10-23 ENCOUNTER — Other Ambulatory Visit: Payer: Self-pay | Admitting: Internal Medicine

## 2011-11-04 ENCOUNTER — Ambulatory Visit (INDEPENDENT_AMBULATORY_CARE_PROVIDER_SITE_OTHER): Payer: 59 | Admitting: Internal Medicine

## 2011-11-04 ENCOUNTER — Other Ambulatory Visit (HOSPITAL_COMMUNITY)
Admission: RE | Admit: 2011-11-04 | Discharge: 2011-11-04 | Disposition: A | Discharge status: Home / Self Care | Payer: 59 | Source: Ambulatory Visit | Attending: Internal Medicine | Admitting: Internal Medicine

## 2011-11-04 ENCOUNTER — Encounter: Payer: Self-pay | Admitting: Internal Medicine

## 2011-11-04 VITALS — BP 108/70 | HR 68 | Temp 98.2°F | Ht 62.75 in | Wt 104.0 lb

## 2011-11-04 DIAGNOSIS — Z113 Encounter for screening for infections with a predominantly sexual mode of transmission: Secondary | ICD-10-CM | POA: Insufficient documentation

## 2011-11-04 DIAGNOSIS — Z8619 Personal history of other infectious and parasitic diseases: Secondary | ICD-10-CM

## 2011-11-04 DIAGNOSIS — Z3041 Encounter for surveillance of contraceptive pills: Secondary | ICD-10-CM

## 2011-11-04 DIAGNOSIS — Z0000 Encounter for general adult medical examination without abnormal findings: Secondary | ICD-10-CM

## 2011-11-04 DIAGNOSIS — R6889 Other general symptoms and signs: Secondary | ICD-10-CM

## 2011-11-04 DIAGNOSIS — N921 Excessive and frequent menstruation with irregular cycle: Secondary | ICD-10-CM

## 2011-11-04 DIAGNOSIS — R51 Headache: Secondary | ICD-10-CM

## 2011-11-04 DIAGNOSIS — Z01419 Encounter for gynecological examination (general) (routine) without abnormal findings: Secondary | ICD-10-CM | POA: Insufficient documentation

## 2011-11-04 DIAGNOSIS — Z1159 Encounter for screening for other viral diseases: Secondary | ICD-10-CM | POA: Insufficient documentation

## 2011-11-04 DIAGNOSIS — F419 Anxiety disorder, unspecified: Secondary | ICD-10-CM

## 2011-11-04 DIAGNOSIS — F411 Generalized anxiety disorder: Secondary | ICD-10-CM

## 2011-11-04 LAB — CBC WITH DIFFERENTIAL/PLATELET
Basophils Relative: 0.4 % (ref 0.0–3.0)
Eosinophils Absolute: 0.1 10*3/uL (ref 0.0–0.7)
Hemoglobin: 14.4 g/dL (ref 12.0–15.0)
MCHC: 33.8 g/dL (ref 30.0–36.0)
MCV: 94 fl (ref 78.0–100.0)
Monocytes Absolute: 0.7 10*3/uL (ref 0.1–1.0)
Neutro Abs: 4.9 10*3/uL (ref 1.4–7.7)
Neutrophils Relative %: 48 % (ref 43.0–77.0)
RBC: 4.53 Mil/uL (ref 3.87–5.11)
RDW: 14.6 % (ref 11.5–14.6)

## 2011-11-04 LAB — BASIC METABOLIC PANEL
CO2: 24 mEq/L (ref 19–32)
Chloride: 104 mEq/L (ref 96–112)
Creatinine, Ser: 1 mg/dL (ref 0.4–1.2)
Glucose, Bld: 84 mg/dL (ref 70–99)

## 2011-11-04 LAB — POCT URINALYSIS DIPSTICK
Bilirubin, UA: NEGATIVE
Glucose, UA: NEGATIVE
Ketones, UA: NEGATIVE
Spec Grav, UA: 1.02
Urobilinogen, UA: 0.2

## 2011-11-04 LAB — LIPID PANEL
HDL: 67.7 mg/dL (ref >39.00)
Triglycerides: 115 mg/dL (ref 0.0–149.0)

## 2011-11-04 LAB — IBC PANEL: Saturation Ratios: 23.5 % (ref 20.0–50.0)

## 2011-11-04 LAB — HEPATIC FUNCTION PANEL
Albumin: 4.1 g/dL (ref 3.5–5.2)
Total Protein: 7.2 g/dL (ref 6.0–8.3)

## 2011-11-04 MED ORDER — VALACYCLOVIR HCL 500 MG PO TABS: 2000.0000 mg | ORAL_TABLET | Freq: Two times a day (BID) | ORAL | Status: AC

## 2011-11-04 MED ORDER — PAROXETINE HCL ER 25 MG PO TB24: 25.0000 mg | ORAL_TABLET | ORAL | Status: DC

## 2011-11-04 MED ORDER — NORETHIN ACE-ETH ESTRAD-FE 1.5-30 MG-MCG PO TABS: 1.0000 | ORAL_TABLET | Freq: Every day | ORAL | Status: DC

## 2011-11-04 NOTE — Progress Notes (Signed)
Subjective:    Patient ID: Michelle Patrick, female    DOB: 04/17/1991, 21 y.o.   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 Reactive anxiety but no out of the blue panic attacks   HAS: on topamax and following  BTB in past few months no change in pills ? No change meds . ocass missed pill;  monogomous and no vag dc etc.  "Cold all the time "mom said check thyroid and iron levels. Needs refill son the valtrex for cold sores  Runs out when needs more. NO injuries hospitalizations  Breast lumps no change and advise to just follow  . No need for futher imaging  Review of Systems ROS:  Tired all the time    GEN/ HEENT: No fever, significant weight changes sweats headaches vision problems hearing changes, CV/ PULM; No chest pain shortness of breath cough, syncope,edema  change in exercise tolerance. GI /GU: No adominal pain, vomiting, change in bowel habits. No blood in the stool. No significant GU symptoms. Except above SKIN/HEME: ,no acute skin rashes suspicious lesions or bleeding. No lymphadenopathy, nodules, masses.  NEURO/ PSYCH:  No neurologic signs such as weakness numbness. No depression  IMM/ Allergy: No unusual infections.  Allergy .   REST of 12 system review negative except as per HPI  Past history family history social history reviewed in the electronic medical record. Outpatient Prescriptions Prior to Visit  Medication Sig Dispense Refill  . acyclovir (ZOVIRAX) 5 % ointment Apply topically every 3 (three) hours.        Marland Kitchen PARoxetine (PAXIL-CR) 12.5 MG 24 hr tablet Take 12.5 mg by mouth every morning.        . topiramate (TOPAMAX) 50 MG tablet Take 75 mg by mouth daily.        Marland Kitchen GILDESS FE 1/20 1-20 MG-MCG tablet TAKE 1 TABLET BY MOUTH ONCE DAILY  28 tablet  0  . valACYclovir (VALTREX) 500 MG tablet Take 2 tablets (1,000 mg total) by mouth 2 (two) times daily.  30 tablet  0  . eletriptan (RELPAX) 40 MG tablet One tablet by mouth as needed for migraine headache.  If the headache improves and then returns, dose may be repeated after 2 hours have elapsed since first dose (do not exceed 80 mg per day). may repeat in 2 hours if necessary             Objective:   Physical Exam Physical Exam: Vital signs reviewed [id number] is a well-developed well-nourished alert cooperative  white female who appears her stated age in no acute distress.  HEENT: normocephalic atraumatic , Eyes: PERRL EOM's full, conjunctiva clear, Nares: paten,t no deformity discharge or tenderness., Ears: no deformity EAC's clear TMs with normal landmarks. Mouth: clear OP, no lesions, edema.  Moist mucous membranes. Dentition in adequate repair. NECK: supple without masses, thyromegaly or bruits. CHEST/PULM:  Clear to auscultation and percussion breath sounds equal no wheeze , rales or rhonchi. No chest wall deformities or tenderness. CV: PMI is nondisplaced, S1 S2 no gallops, murmurs, rubs. Peripheral pulses are full without delay.No JVD .  Breast: normal by inspection .  No dimpling, discharge,, tenderness or discharge . lummpiness left more than right   ABDOMEN: Bowel sounds normal nontender  No guard or rebound, no hepato splenomegal no CVA tenderness.  No hernia. Extremtities:  No clubbing cyanosis or edema, no acute joint swelling or redness no focal atrophy NEURO:  Oriented x3, cranial nerves 3-12 appear to be intact, no obvious focal weakness,gait within normal limits no abnormal reflexes or asymmetrical SKIN: No acute rashes normal turgor, color, no bruising or petechiae. tatoos PSYCH: Oriented, good eye contact, no obvious depression anxiety, cognition and judgment appear normal. LN: no  cervical axillary inguinal adenopathy palpable neck glands  Pelvic: NL ext GU, labia clear without lesions or rash . Vagina no lesions .Cervix: clear  irreg ectopy no firability  or lesions UTERUS: Neg CMT Adnexa:  clear no masses . PAP done    Lab Results  Component Value Date   WBC 7.4 10/23/2010   HGB 13.3 10/23/2010   HCT 39.0 10/23/2010   PLT 273.0 10/23/2010   GLUCOSE 74 08/16/2009   CHOL 163 08/16/2009   TRIG 93 08/16/2009   HDL 64 08/16/2009   LDLCALC 80 08/16/2009   ALT 15 08/16/2009   AST 21 08/16/2009   NA 140 08/16/2009   K 3.9 08/16/2009   CL 107 08/16/2009   CREATININE 1.14 08/16/2009   BUN 13 08/16/2009   CO2 18* 08/16/2009   TSH 1.46 08/16/2009       Assessment & Plan:  Preventive Health Care Counseled regarding healthy nutrition, exercise, sleep, injury prevention, calcium vit d and healthy weight .pap done today   OCPS no sig se of this and can refl=ill   Fibroadenoma  Told to fu  When 39 r as neeed  Not larger harder to feel  BTB  On 1;20 pill  R/o infection; prob from low dose pill will  Increase to 30 mcg pill   Anxiety prev on med per neuro . Not conrolled    Options discussed  Seems to be from external factors consider counseling but  Will increase dose of paxil for now and follow up   Headache per specialty care  . relpax and topamax per neuro   FORm for school  Entry and immuniz review  Not done pre visit  Will review to see what she needs  And return for this   Labs today and sti screen   Cold intolerance  Check for anemia and  Thyroid   HSV labialis  Dis use of med  Have  On hand and refill today

## 2011-11-04 NOTE — Patient Instructions (Addendum)
Will notify you  of labs when available.  Increase the OCPs to 1 30  Pill.   Will review your immunizations    Increase the paxil dose as directed   Track your bleeding  Will check over immunizations for form.  ROV in 4-6 weeks or as needed

## 2011-11-05 LAB — HIV ANTIBODY (ROUTINE TESTING W REFLEX): HIV: NONREACTIVE

## 2011-11-08 DIAGNOSIS — F419 Anxiety disorder, unspecified: Secondary | ICD-10-CM | POA: Insufficient documentation

## 2011-11-08 DIAGNOSIS — N921 Excessive and frequent menstruation with irregular cycle: Secondary | ICD-10-CM | POA: Insufficient documentation

## 2011-11-08 DIAGNOSIS — R6889 Other general symptoms and signs: Secondary | ICD-10-CM | POA: Insufficient documentation

## 2011-11-08 DIAGNOSIS — B001 Herpesviral vesicular dermatitis: Secondary | ICD-10-CM | POA: Insufficient documentation

## 2011-11-08 DIAGNOSIS — Z01419 Encounter for gynecological examination (general) (routine) without abnormal findings: Secondary | ICD-10-CM | POA: Insufficient documentation

## 2011-11-08 DIAGNOSIS — Z3041 Encounter for surveillance of contraceptive pills: Secondary | ICD-10-CM | POA: Insufficient documentation

## 2011-11-10 NOTE — Progress Notes (Signed)
Quick Note:  Tell patient PAP is normal. Rest or labs as previously reviewed ______

## 2011-11-11 ENCOUNTER — Telehealth: Payer: Self-pay

## 2011-11-11 NOTE — Telephone Encounter (Signed)
Called Michelle Patrick to make aware that per Dr. Fabian Sharp Michelle Patrick needs a Tdap and PPD (optional varicella and Hep A).  A rabies vaccination is optional and the [facility] Department can give this to Michelle Patrick.  Left a message for Michelle Patrick to return call.

## 2011-11-12 ENCOUNTER — Telehealth: Payer: Self-pay | Admitting: *Deleted

## 2011-11-12 NOTE — Telephone Encounter (Signed)
Pt aware of lab results and immunizations needed.

## 2011-11-12 NOTE — Telephone Encounter (Signed)
Pt. Would like to have lab results, please.

## 2011-11-12 NOTE — Telephone Encounter (Signed)
Pt aware of lab results 

## 2011-11-13 ENCOUNTER — Ambulatory Visit (INDEPENDENT_AMBULATORY_CARE_PROVIDER_SITE_OTHER): Payer: 59

## 2011-11-13 DIAGNOSIS — Z209 Contact with and (suspected) exposure to unspecified communicable disease: Secondary | ICD-10-CM

## 2011-11-13 DIAGNOSIS — Z23 Encounter for immunization: Secondary | ICD-10-CM

## 2011-11-16 LAB — TB SKIN TEST: TB Skin Test: NEGATIVE mm

## 2011-11-18 ENCOUNTER — Telehealth: Payer: Self-pay | Admitting: *Deleted

## 2011-11-18 NOTE — Telephone Encounter (Signed)
OPENED IN ERROR

## 2012-03-14 ENCOUNTER — Other Ambulatory Visit: Payer: Self-pay | Admitting: Internal Medicine

## 2012-04-01 ENCOUNTER — Telehealth: Payer: Self-pay | Admitting: Internal Medicine

## 2012-04-01 DIAGNOSIS — Z0279 Encounter for issue of other medical certificate: Secondary | ICD-10-CM

## 2012-04-01 NOTE — Telephone Encounter (Signed)
Letter  Is done need ROV when can get in

## 2012-04-01 NOTE — Telephone Encounter (Signed)
Pts mom called and said that she faxed over 2 Designated Party Release Forms and a Letter that her daughter  has written re: problems she is having in should. Pt has PTSD and is needing Dr Fabian Sharp to write a letter, stating that pt needs to be allowed extra time for taking test and quizzes at school. All the info in on the fax that pt has sent.

## 2012-04-01 NOTE — Telephone Encounter (Signed)
I spoke to Michelle Patrick [facility]) and she informed me that the pt is in school 5 days a week.  She will call and make an appt around a holiday when she will be home.  Mom is requesting letter.

## 2012-04-01 NOTE — Telephone Encounter (Signed)
Called and left message.  Dr. Fabian Sharp will be unable to complete the letter today but will be able to get to it next week.

## 2012-04-05 NOTE — Telephone Encounter (Signed)
Mom notified that she may pick up the letter.  She informed me that it may be dad who picks it up.

## 2012-05-16 ENCOUNTER — Ambulatory Visit (INDEPENDENT_AMBULATORY_CARE_PROVIDER_SITE_OTHER): Payer: 59 | Admitting: Internal Medicine

## 2012-05-16 ENCOUNTER — Encounter: Payer: Self-pay | Admitting: Internal Medicine

## 2012-05-16 VITALS — BP 124/80 | HR 106 | Temp 98.2°F | Wt 101.0 lb

## 2012-05-16 DIAGNOSIS — Z3041 Encounter for surveillance of contraceptive pills: Secondary | ICD-10-CM

## 2012-05-16 DIAGNOSIS — F419 Anxiety disorder, unspecified: Secondary | ICD-10-CM

## 2012-05-16 DIAGNOSIS — F411 Generalized anxiety disorder: Secondary | ICD-10-CM

## 2012-05-16 DIAGNOSIS — R51 Headache: Secondary | ICD-10-CM

## 2012-05-16 DIAGNOSIS — R519 Headache, unspecified: Secondary | ICD-10-CM

## 2012-05-16 DIAGNOSIS — Z23 Encounter for immunization: Secondary | ICD-10-CM

## 2012-05-16 MED ORDER — VENLAFAXINE HCL ER 75 MG PO CP24: 75.0000 mg | ORAL_CAPSULE | Freq: Every day | ORAL | Status: DC

## 2012-05-16 MED ORDER — NORETHIN ACE-ETH ESTRAD-FE 1.5-30 MG-MCG PO TABS: 1.0000 | ORAL_TABLET | Freq: Every day | ORAL | Status: DC

## 2012-05-16 NOTE — Progress Notes (Signed)
  Subjective:    Patient ID: Michelle Patrick, female    DOB: [birth year], 21 y.o.   MRN: [id number]  HPI Patient comes in today for follow up of  multiple medical problems.  OCPs  Bleeding better and feels well on the current dose at this time would like refills. Anxiety headaches still sees [facility]   And stable  So far . doesn't think the paxil helps still worried a lot and is problematic . Doesn't like it.  ? No counseling now.   Review of Systems No cp sob bleeding vision change  etoh caffiene   Past history family history social history reviewed in the electronic medical record.     Objective:   Physical Exam BP 124/80  Pulse 106  Temp 98.2 F (36.8 C) (Oral)  Wt 101 lb (45.813 kg)  SpO2 98%  LMP 05/03/2012 wdwn in nad HEENT Junior op clear eoms nl Neck: Supple without or masses or bruits Chest:  Clear to A&P without wheezes rales or rhonchi CV:  S1-S2 no gallops or murmurs peripheral perfusion is normal Abdomen:  Sof,t normal bowel sounds without hepatosplenomegaly, no guarding rebound or masses no CVA tenderness No clubbing cyanosis or edema Oriented x 3. Normal cognition, attention, speech. Minimally t anxious not  depressed appearing   Good eye contact .    Assessment & Plan:  OCPS  Better  Ok to refill medication for 6 months Anxiety   Pt thinks not helped by  Current med  options discussed  Stay same dose increase or change med willing to try med   effexor  xr 75 and consider inc to 150 mg   Serotonin risk no greater than current regimen. HAS on topamax uses med ocass  Couple times month at most menses is a trigger.

## 2012-05-16 NOTE — Assessment & Plan Note (Signed)
Pt thinks not helped by  Current med  options discussed  Stay same dose increase or change med willing to try med   effexor  xr 75 and consider inc to 150 mg   Serotonin risk no greater than current regimen.

## 2012-05-16 NOTE — Patient Instructions (Addendum)
Can try different medication for anxiety.   Would have you do a straight switch. Stop the paxil and  Begin 75 mg of Effexor XR ;1 day we might increase it  To 2 per day  at some point it is not controlling the anxiety.   This particular medicine has also been helpful for her headaches in some people. Could have some nausea in transition when changing meds.   Continue the hormonal therapy contact us for refills.

## 2012-08-04 ENCOUNTER — Other Ambulatory Visit: Payer: Self-pay | Admitting: Internal Medicine

## 2012-08-04 ENCOUNTER — Telehealth: Payer: Self-pay | Admitting: Internal Medicine

## 2012-08-04 MED ORDER — VENLAFAXINE HCL ER 75 MG PO CP24: 150.0000 mg | ORAL_CAPSULE | Freq: Every day | ORAL | Status: DC

## 2012-08-04 NOTE — Telephone Encounter (Signed)
Pt was here in October. At that time, she states that Dr. Fabian Sharp told her to call back if she wanted to take 2 of her Effexor. She does. Please advise.

## 2012-08-04 NOTE — Telephone Encounter (Signed)
Pt notified by telephone.  New rx sent to the pharmacy.

## 2012-08-04 NOTE — Telephone Encounter (Signed)
Ok to increase to 2- 75 mg per day effexor xr   ie total 150 mg .  Can send in #60 of the 75mg  x      ROV before runs out of this rx

## 2012-08-04 NOTE — Telephone Encounter (Signed)
Pt was here on 05/16/12.  It is mentioned in the note that you may increase the dose.  Please advise.  Thanks!!

## 2012-08-12 ENCOUNTER — Ambulatory Visit (INDEPENDENT_AMBULATORY_CARE_PROVIDER_SITE_OTHER): Payer: 59 | Admitting: Internal Medicine

## 2012-08-12 ENCOUNTER — Encounter: Payer: Self-pay | Admitting: Internal Medicine

## 2012-08-12 VITALS — BP 118/82 | HR 100 | Temp 98.2°F | Wt 102.0 lb

## 2012-08-12 DIAGNOSIS — J22 Unspecified acute lower respiratory infection: Secondary | ICD-10-CM

## 2012-08-12 DIAGNOSIS — J988 Other specified respiratory disorders: Secondary | ICD-10-CM

## 2012-08-12 NOTE — Progress Notes (Signed)
Chief Complaint  Patient presents with  . Fever    Sx started on Monday.  . Cough  . Sore Throat  . Headache  . Generalized Body Aches    HPI: Patient comes in today for SDA for  new problem evaluation. Onset about 4 days ago of above symptoms. Sore throat cough congestion malaise and body aches. No shortness of breath swollen glands strep exposure. She began school in 3 days and comes in to get checked out. Fever has Subsided. She had an outbreak of a cold sore and did begin in the Valtrex otherwise no new medicines  Since her last visit she's now on Effexor instead of Paxil and seems to be doing well according to her. ROS: See pertinent positives and negatives per HPI.  Past Medical History  Diagnosis Date  . Dysmenorrhea     on ocps   . Recurrent UTI   . Anxiety   . Generalized headaches     has seen Dr Sharene Skeans  now dr Haskell Riling    No family history on file.  History   Social History  . Marital Status: Single    Spouse Name: N/A    Number of Children: N/A  . Years of Education: N/A   Social History Main Topics  . Smoking status: Never Smoker   . Smokeless tobacco: Never Used  . Alcohol Use: No  . Drug Use: No  . Sexually Active: Yes    Birth Control/ Protection: Pill   Other Topics Concern  . None   Social History Narrative   HH of 3 Both parents smoke but not around herPet dogs and catsLives at home [employer] ChildcareTo go to vet tech school and has form communtes to [employer]  Studying     Outpatient Encounter Prescriptions as of 08/12/2012  Medication Sig Dispense Refill  . acyclovir (ZOVIRAX) 5 % ointment Apply topically every 3 (three) hours.        Marland Kitchen eletriptan (RELPAX) 40 MG tablet One tablet by mouth as needed for migraine headache.  If the headache improves and then returns, dose may be repeated after 2 hours have elapsed since first dose (do not exceed 80 mg per day). may repeat in 2 hours if necessary       . norethindrone-ethinyl estradiol-iron  (GILDESS FE 1.5/30) 1.5-30 MG-MCG tablet Take 1 tablet by mouth daily.  28 tablet  5  . topiramate (TOPAMAX) 50 MG tablet Take 75 mg by mouth daily.       . valACYclovir (VALTREX) 500 MG tablet Take 4 tablets (2,000 mg total) by mouth 2 (two) times daily. For cold sores  30 tablet  3  . venlafaxine XR (EFFEXOR-XR) 75 MG 24 hr capsule Take 2 capsules (150 mg total) by mouth daily.  60 capsule  0  . [DISCONTINUED] PARoxetine (PAXIL-CR) 25 MG 24 hr tablet Take 1 tablet (25 mg total) by mouth every morning.  30 tablet  5    EXAM:  BP 118/82  Pulse 100  Temp 98.2 F (36.8 C) (Oral)  Wt 102 lb (46.267 kg)  SpO2 99%  LMP 08/01/2012  There is no height on file to calculate BMI.  GENERAL: vitals reviewed and listed above, alert, oriented, appears well hydrated and in no acute distress  HEENT: Normocephalic ;atraumatic , Eyes;  PERRL, EOMs  Full, lids and conjunctiva clear,,Ears: no deformities, canals nl, TM landmarks normal, Nose: no deformity or discharge congested no face tenderness Mouth : OP clear without lesion or edema .  Mild erythema no exudate NECK: no obvious masses on inspection palpation no significant adenopathy  LUNGS: clear to auscultation bilaterally, no wheezes, rales or rhonchi, good air movement  CV: HRRR, no clubbing cyanosis or  peripheral edema nl cap refill  Abdomen soft without organomegaly guarding or rebound MS: moves all extremities without noticeable focal  Abnormality Neuro non focal  ASSESSMENT AND PLAN:  Discussed the following assessment and plan:  1. Acute respiratory infection    Appears to be viral uncomplicated at this time expectant management this could be low-grade flu and she has been immunized. Contact us for relapsing or alarm sx   -Patient advised to return or notify health care team  immediately if symptoms worsen or persist or new concerns arise.  Patient Instructions  I think this is an acute viral infection   Doesn't seem like a strep  infection .   Your lung exam is normal  Expect improvement in  Another week    y Contact us if  high fever relapsing sx of severe pain  And not improved in another week  Gargles fluids rest  Tylenol of ibuprofen for body aches .          Neta Mends. Panosh M.D.

## 2012-08-12 NOTE — Patient Instructions (Signed)
I think this is an acute viral infection   Doesn't seem like a strep infection .   Your lung exam is normal  Expect improvement in  Another week    y Contact us if  high fever relapsing sx of severe pain  And not improved in another week  Gargles fluids rest  Tylenol of ibuprofen for body aches .

## 2012-09-22 ENCOUNTER — Other Ambulatory Visit: Payer: Self-pay | Admitting: Internal Medicine

## 2012-09-22 MED ORDER — VENLAFAXINE HCL ER 75 MG PO CP24: 150.0000 mg | ORAL_CAPSULE | Freq: Every day | ORAL | Status: DC

## 2012-11-22 ENCOUNTER — Other Ambulatory Visit: Payer: Self-pay | Admitting: Internal Medicine

## 2012-11-22 ENCOUNTER — Telehealth: Payer: Self-pay | Admitting: Internal Medicine

## 2012-11-22 MED ORDER — VENLAFAXINE HCL ER 75 MG PO CP24: 150.0000 mg | ORAL_CAPSULE | Freq: Every day | ORAL | Status: DC

## 2012-11-22 NOTE — Telephone Encounter (Signed)
Pt has made appt for May 2 @ 9:15am. [facility] [address] [place]

## 2012-11-22 NOTE — Telephone Encounter (Signed)
PT called to request refill of venlafaxine XR (EFFEXOR-XR) 75 MG 24 hr capsule. Please assist.

## 2012-11-22 NOTE — Telephone Encounter (Signed)
Sent by e-scribe. 

## 2012-12-02 ENCOUNTER — Encounter: Payer: 59 | Admitting: Internal Medicine

## 2012-12-02 ENCOUNTER — Encounter: Payer: Self-pay | Admitting: Internal Medicine

## 2012-12-02 ENCOUNTER — Ambulatory Visit (INDEPENDENT_AMBULATORY_CARE_PROVIDER_SITE_OTHER): Payer: 59 | Admitting: Internal Medicine

## 2012-12-02 VITALS — BP 118/84 | HR 74 | Temp 98.4°F | Wt 97.0 lb

## 2012-12-02 DIAGNOSIS — F419 Anxiety disorder, unspecified: Secondary | ICD-10-CM

## 2012-12-02 DIAGNOSIS — T43205A Adverse effect of unspecified antidepressants, initial encounter: Secondary | ICD-10-CM

## 2012-12-02 DIAGNOSIS — F411 Generalized anxiety disorder: Secondary | ICD-10-CM

## 2012-12-02 DIAGNOSIS — Z3041 Encounter for surveillance of contraceptive pills: Secondary | ICD-10-CM

## 2012-12-02 DIAGNOSIS — R454 Irritability and anger: Secondary | ICD-10-CM

## 2012-12-02 MED ORDER — FLUOXETINE HCL 20 MG PO TABS: 20.0000 mg | ORAL_TABLET | Freq: Every day | ORAL | Status: DC

## 2012-12-02 NOTE — Patient Instructions (Signed)
Decrease to one effexor per day     75 mg  After 1 week then change to fluoxetine 20 mg per day .    Then plan rov in 3-4 weeks    consideration of medication consult   As we discussed to  Help you feel better .

## 2012-12-02 NOTE — Progress Notes (Deleted)
No chief complaint on file.   HPI:  ROS: See pertinent positives and negatives per HPI.  Past Medical History  Diagnosis Date  . Dysmenorrhea     on ocps   . Recurrent UTI   . Anxiety   . Generalized headaches     has seen Dr Sharene Skeans  now dr Haskell Riling    History reviewed. No pertinent family history.  History   Social History  . Marital Status: Single    Spouse Name: N/A    Number of Children: N/A  . Years of Education: N/A   Social History Main Topics  . Smoking status: Never Smoker   . Smokeless tobacco: Never Used  . Alcohol Use: No  . Drug Use: No  . Sexually Active: Yes    Birth Control/ Protection: Pill   Other Topics Concern  . None   Social History Narrative   HH of 3    Both parents smoke but not around her   Pet dogs and cats   Lives at home [place]   Works Childcare      To go to [employer] and has form    communtes to [facility] Encounter Prescriptions as of 12/02/2012  Medication Sig Dispense Refill  . acyclovir (ZOVIRAX) 5 % ointment Apply topically every 3 (three) hours.        Marland Kitchen eletriptan (RELPAX) 40 MG tablet One tablet by mouth as needed for migraine headache.  If the headache improves and then returns, dose may be repeated after 2 hours have elapsed since first dose (do not exceed 80 mg per day). may repeat in 2 hours if necessary       . norethindrone-ethinyl estradiol-iron (GILDESS FE 1.5/30) 1.5-30 MG-MCG tablet Take 1 tablet by mouth daily.  28 tablet  5  . topiramate (TOPAMAX) 50 MG tablet Take 75 mg by mouth daily.       Marland Kitchen venlafaxine XR (EFFEXOR-XR) 75 MG 24 hr capsule Take 2 capsules (150 mg total) by mouth daily.  60 capsule  0   No facility-administered encounter medications on file as of 12/02/2012.    EXAM:  There were no vitals taken for this visit.  There is no weight on file to calculate BMI.  GENERAL: vitals reviewed and listed above, alert, oriented, appears well hydrated and in no acute  distress  HEENT: atraumatic, conjunctiva  clear, no obvious abnormalities on inspection of external nose and ears OP : no lesion edema or exudate   NECK: no obvious masses on inspection palpation   LUNGS: clear to auscultation bilaterally, no wheezes, rales or rhonchi, good air movement  CV: HRRR, no clubbing cyanosis or  peripheral edema nl cap refill   MS: moves all extremities without noticeable focal  abnormality  PSYCH: pleasant and cooperative, no obvious depression or anxiety  ASSESSMENT AND PLAN:  Discussed the following assessment and plan:  No diagnosis found.  -Patient advised to return or notify health care team  if symptoms worsen or persist or new concerns arise.  There are no Patient Instructions on file for this visit.   Neta Mends. Panosh M.D.

## 2012-12-02 NOTE — Progress Notes (Signed)
Chief Complaint  Patient presents with  . Follow-up    Medications    HPI: Patient comes in today for follow up of medical problems. In management Taking effexor off and  Not a fan and gets dizzy; uncertain if from med or now was trying to wean.    Took for months and trying. ? To decrease it sometimes skips it feels worse Dizzness.   Anxiety  is So so . Stress a lot    Irritable and feels like she is mean  To everyone  Not myself .  Some sadness. ? If other causing sx.  No other hx.  Last dose  Of effexor   was today and she took 2 also concerned about decreased libido certain times of month uncertain if this is from medication or not. She is finishing school but working on a [employer]. Headaches are about the same mostly related to periods where she takes Relpax. She is on birth control pills states that her boyfriend wants to marry her but she doesn't understand why he would want to be with someone who is so mean to him.  Feels moody. This may run in the family she says her mother is probably on Xanax but doesn't know what other medication. Not suicidal no panic attacks ROS: See pertinent positives and negatives per HPI.  Past Medical History  Diagnosis Date  . Dysmenorrhea     on ocps   . Recurrent UTI   . Anxiety   . Generalized headaches     has seen Dr Sharene Skeans  now dr Haskell Riling    No family history on file.  History   Social History  . Marital Status: Single    Spouse Name: N/A    Number of Children: N/A  . Years of Education: N/A   Social History Main Topics  . Smoking status: Never Smoker   . Smokeless tobacco: Never Used  . Alcohol Use: No  . Drug Use: No  . Sexually Active: Yes    Birth Control/ Protection: Pill   Other Topics Concern  . None   Social History Narrative   HH of 3    Both parents smoke but not around her   Pet dogs and cats   Lives at home [place]   Works Childcare      To go to [employer] and has form    communtes to  [facility] Encounter Prescriptions as of 12/02/2012  Medication Sig Dispense Refill  . acyclovir (ZOVIRAX) 5 % ointment Apply topically every 3 (three) hours.        Marland Kitchen eletriptan (RELPAX) 40 MG tablet One tablet by mouth as needed for migraine headache.  If the headache improves and then returns, dose may be repeated after 2 hours have elapsed since first dose (do not exceed 80 mg per day). may repeat in 2 hours if necessary       . naratriptan (AMERGE) 2.5 MG tablet       . norethindrone-ethinyl estradiol-iron (GILDESS FE 1.5/30) 1.5-30 MG-MCG tablet Take 1 tablet by mouth daily.  28 tablet  5  . topiramate (TOPAMAX) 50 MG tablet Take 75 mg by mouth daily.       Marland Kitchen venlafaxine XR (EFFEXOR-XR) 75 MG 24 hr capsule Take 2 capsules (150 mg total) by mouth daily.  60 capsule  0  . FLUoxetine (PROZAC) 20 MG tablet Take 1 tablet (20 mg total) by mouth daily.  30 tablet  3   No facility-administered encounter medications on file as of 12/02/2012.    EXAM:  BP 118/84  Pulse 74  Temp(Src) 98.4 F (36.9 C) (Oral)  Wt 97 lb (43.999 kg)  BMI 17.32 kg/m2  SpO2 98%  LMP 10/20/2012  Body mass index is 17.32 kg/(m^2).  GENERAL: vitals reviewed and listed above, alert, oriented, appears well hydrated and in no acute distress appears generally well somewhat anxious HEENT: atraumatic, conjunctiva  clear, no obvious abnormalities on inspection of external nose and ears OP : no lesion edema or exudate  NECK: no obvious masses on inspection palpation  LUNGS: clear to auscultation bilaterally, no wheezes, rales or rhonchi, good air movement CV: HRRR, no clubbing cyanosis or  peripheral edema nl cap refill  Abdomen soft without organomegaly guarding or rebound. Umbilicus tattoo MS: moves all extremities without noticeable focal  abnormality PSYCH: pleasant and cooperative,  looks mildly anxious good eye contact normal speech  ASSESSMENT AND PLAN:  Discussed the following assessment  and plan:  Anxiety disorder  Adverse effect of antidepressant, initial encounter - Dizziness libido  Oral contraceptive use  Irritability Uncertain how much depression is with anxiety there is some irritability involved. Uncertain if the mood stabilizer is helpful. At this time we'll try to wean her down off the Effexor and switch over to you fluoxetine which may have more stable drug levels. And also does help PMS.   Urged her sign out for patient portal so she can contact us with concern side effects difficulties. Warned her that the transition may be a little bit rocky Also discussed the possibility of medication consult with psychiatry to get her on the best regimen.  -Patient advised to return or notify health care team  if symptoms worsen or persist or new concerns arise.  Patient Instructions  Decrease to one effexor per day     75 mg  After 1 week then change to fluoxetine 20 mg per day .    Then plan rov in 3-4 weeks    consideration of medication consult   As we discussed to  Help you feel better .      Neta Mends. Massie Cogliano M.D.  Total visit > 50% spent counseling and coordinating care

## 2012-12-03 NOTE — Progress Notes (Signed)
  Subjective:    Patient ID: Michelle Patrick, female    DOB: April 12, 1991, 22 y.o.   MRN: [id number]  HPI    Review of Systems     Objective:   Physical Exam        Assessment & Plan:    Opened fore revivewe   Pt seen later in day as late for visit

## 2012-12-06 ENCOUNTER — Ambulatory Visit: Payer: 59 | Admitting: Internal Medicine

## 2013-01-10 ENCOUNTER — Other Ambulatory Visit: Payer: Self-pay | Admitting: Family Medicine

## 2013-01-10 MED ORDER — VENLAFAXINE HCL ER 75 MG PO CP24: 150.0000 mg | ORAL_CAPSULE | Freq: Every day | ORAL | Status: DC

## 2013-03-31 ENCOUNTER — Other Ambulatory Visit: Payer: Self-pay | Admitting: Family Medicine

## 2013-03-31 MED ORDER — FLUOXETINE HCL 20 MG PO TABS: 20.0000 mg | ORAL_TABLET | Freq: Every day | ORAL | Status: DC

## 2013-04-14 ENCOUNTER — Other Ambulatory Visit: Payer: Self-pay | Admitting: Family Medicine

## 2013-04-14 MED ORDER — NORETHIN ACE-ETH ESTRAD-FE 1.5-30 MG-MCG PO TABS: 1.0000 | ORAL_TABLET | Freq: Every day | ORAL | Status: DC

## 2013-08-04 ENCOUNTER — Other Ambulatory Visit: Payer: Self-pay | Admitting: Internal Medicine

## 2013-09-27 ENCOUNTER — Other Ambulatory Visit: Payer: Self-pay | Admitting: Internal Medicine

## 2013-10-23 ENCOUNTER — Telehealth: Payer: Self-pay

## 2013-10-23 NOTE — Telephone Encounter (Signed)
Pt req refill on GILDESS FE 1.5/30 1.5-30 MG-MCG tablet and also Prozac 20mg    Pharmacy ;[facility]

## 2013-10-23 NOTE — Telephone Encounter (Signed)
According to the chart, pt should have refills.  Called and spoke to pharmacy.  Pt picked up Prozac on 10/21/13 and had one remaining refill on bc.  Asked pharmacy to fill bc. Pt has upcoming appointment on 10/31/13.

## 2013-10-31 ENCOUNTER — Encounter: Payer: Self-pay | Admitting: Internal Medicine

## 2013-10-31 ENCOUNTER — Ambulatory Visit (INDEPENDENT_AMBULATORY_CARE_PROVIDER_SITE_OTHER): Payer: 59 | Admitting: Internal Medicine

## 2013-10-31 VITALS — BP 116/70 | Temp 97.4°F | Ht 62.5 in | Wt 104.0 lb

## 2013-10-31 DIAGNOSIS — F411 Generalized anxiety disorder: Secondary | ICD-10-CM

## 2013-10-31 DIAGNOSIS — Z3041 Encounter for surveillance of contraceptive pills: Secondary | ICD-10-CM

## 2013-10-31 DIAGNOSIS — R51 Headache: Secondary | ICD-10-CM

## 2013-10-31 DIAGNOSIS — Z79899 Other long term (current) drug therapy: Secondary | ICD-10-CM

## 2013-10-31 DIAGNOSIS — R519 Headache, unspecified: Secondary | ICD-10-CM

## 2013-10-31 DIAGNOSIS — F419 Anxiety disorder, unspecified: Secondary | ICD-10-CM

## 2013-10-31 MED ORDER — NORETHIN ACE-ETH ESTRAD-FE 1.5-30 MG-MCG PO TABS: ORAL_TABLET | ORAL | Status: DC

## 2013-10-31 MED ORDER — FLUOXETINE HCL 20 MG PO CAPS: 20.0000 mg | ORAL_CAPSULE | Freq: Two times a day (BID) | ORAL | Status: DC

## 2013-10-31 NOTE — Assessment & Plan Note (Signed)
Consider continuous therapy on OCPs asks Dr. Domingo Cocking and contact us if she's interested. Appears to be a menstrual migraine component.

## 2013-10-31 NOTE — Patient Instructions (Signed)
Consider   Taking ocps continuously for 3 months to see if get less headaches . Ask dr Domingo Cocking about this .  Other options for anxiety management are   lexapro , citalopram and sertraline . These are in the same category  as  Fluoxetine . Sometimes changing  Med is helpful. Marland Kitchen     You are du for a PAP smear in  the next 6-8 months .

## 2013-10-31 NOTE — Progress Notes (Signed)
Chief Complaint  Patient presents with  . Follow-up    Med Check    HPI: Michelle Patrick  comes in today for follow up of  multiple medical issues.. she got a letter in the mail that she was due to come in to get her medications refilled.  Last seen may 14 and medication ajustment for anxiety . Was suppose dto have  A fu. Weaned off Effexor and on fluoxetine. Has been on OCPs since that time normal bleeding needs a refill last Pap was 2013 here Under care for her headaches from Dr. Domingo Cocking is on Topamax 50 mg once a day and as needed Amerge using it fairly frequently worse around the time of her period. Uncertain if the anxiety medication is working i.e. the fluoxetine. Her mom has the same problem with anxiety and takes fluoxetine and as needed Xanax. 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 Denies significant depression although sad at times but not suicidal and not on a daily basis. Denies TAD Is upcoming appointment with Dr. Domingo Cocking ROS: See pertinent positives and negatives per HPI. Chest pain shortness of breath syncope.  Past Medical History  Diagnosis Date  . Dysmenorrhea     on ocps   . Recurrent UTI   . Anxiety   . Generalized headaches     has seen Dr Gaynell Face  now dr Jalene Mullet    No family history on file.  History   Social History  . Marital Status: Single    Spouse Name: N/A    Number of Children: N/A  . Years of Education: N/A   Social History Main Topics  . Smoking status: Never Smoker   . Smokeless tobacco: Never Used  . Alcohol Use: No  . Drug Use: No  . Sexual Activity: Yes    Birth Control/ Protection: Pill   Other Topics Concern  . None   Social History Narrative   HH of 3    Both parents smoke but not around her   Pet dogs and cats   Lives at home [place] in [place]    Works Childcare      To go to [employer] and  has form    communtes to [employer] in May.     Just got engaged.   Got a  house.     Outpatient Encounter Prescriptions as of 10/31/2013  Medication Sig  . acyclovir (ZOVIRAX) 5 % ointment Apply topically every 3 (three) hours.    Marland Kitchen eletriptan (RELPAX) 40 MG tablet One tablet by mouth as needed for migraine headache.  If the headache improves and then returns, dose may be repeated after 2 hours have elapsed since first dose (do not exceed 80 mg per day). may repeat in 2 hours if necessary   . FLUoxetine (PROZAC) 20 MG capsule Take 1 capsule (20 mg total) by mouth 2 (two) times daily.  . naratriptan (AMERGE) 2.5 MG tablet   . norethindrone-ethinyl estradiol-iron (GILDESS FE 1.5/30) 1.5-30 MG-MCG tablet TAKE 1 TABLET BY MOUTH ONCE DAILY  . topiramate (TOPAMAX) 50 MG tablet Take 75 mg by mouth daily.   . [DISCONTINUED] FLUoxetine (PROZAC) 20 MG capsule TAKE 1 CAPSULE BY MOUTH ONCE DAILY  . [DISCONTINUED] GILDESS FE 1.5/30 1.5-30 MG-MCG tablet TAKE 1 TABLET BY MOUTH ONCE DAILY  . [DISCONTINUED] venlafaxine XR (EFFEXOR-XR)  75 MG 24 hr capsule Take 2 capsules (150 mg total) by mouth daily. Wean as directed    EXAM:  BP 116/70  Temp(Src) 97.4 F (36.3 C) (Oral)  Ht 5' 2.5" (1.588 m)  Wt 104 lb (47.174 kg)  BMI 18.71 kg/m2  Body mass index is 18.71 kg/(m^2).  GENERAL: vitals reviewed and listed above, alert, oriented, appears well hydrated and in no acute distress HEENT: atraumatic, conjunctiva  clear, no obvious abnormalities on inspection of external nose and ears OP : no lesion edema or exudate  NECK: no obvious masses on inspection palpation  LUNGS: clear to auscultation bilaterally, no wheezes, rales or rhonchi, good air movement CV: HRRR, no clubbing cyanosis or  peripheral edema nl cap refill  MS: moves all extremities without noticeable focal  abnormality PSYCH: pleasant and cooperative, no obvious depression or anxiety  ASSESSMENT AND PLAN:  Discussed the  following assessment and plan:  Anxiety disorder - Uncertain effect of the fluoxetine consider increased dose change medication counseling etc.  Medication management  Encounter for birth control pills maintenance - Consider continuous therapy for headache prevention  Generalized headaches - Management per her headache specialist Dr. Domingo Cocking Refill meds OCPs increase Prozac refilled preventive visit with med check in the next 6-8 months contact us in the meantime over the Internet or phone call about other adjustments. Due for a Pap in the next year -Patient advised to return or notify health care team  if symptoms worsen ,persist or new concerns arise.  Patient Instructions  Consider   Taking ocps continuously for 3 months to see if get less headaches . Ask dr Domingo Cocking about this .  Other options for anxiety management are   lexapro , citalopram and sertraline . These are in the same category  as  Fluoxetine . Sometimes changing  Med is helpful. Marland Kitchen     You are du for a PAP smear in  the next 6-8 months .      Standley Brooking. Anfernee Peschke M.D.  Pre visit review using our clinic review tool, if applicable. No additional management support is needed unless otherwise documented below in the visit note. Total visit 84mins > 50% spent counseling and coordinating care

## 2013-10-31 NOTE — Assessment & Plan Note (Addendum)
External factors fluoxetine not for control trial increased to 40 mg a day consider changing medication and contact us for that over the phone her Internet before next visit. She's many life changes recently even though there for the good. prefer to avoid a benzodiazepine in her age group  consdier counseling again.

## 2014-01-24 ENCOUNTER — Telehealth: Payer: Self-pay | Admitting: Internal Medicine

## 2014-01-24 ENCOUNTER — Other Ambulatory Visit: Payer: Self-pay | Admitting: Internal Medicine

## 2014-01-24 NOTE — Telephone Encounter (Signed)
Please send in refills   X 3

## 2014-01-24 NOTE — Telephone Encounter (Signed)
Pt called to ask if she could get a rx for [place]

## 2014-01-25 MED ORDER — VALACYCLOVIR HCL 500 MG PO TABS: 1000.0000 mg | ORAL_TABLET | Freq: Two times a day (BID) | ORAL | Status: DC

## 2014-01-25 NOTE — Addendum Note (Signed)
Addended by: Miles Costain T on: 01/25/2014 12:27 PM   Modules accepted: Orders

## 2014-01-25 NOTE — Telephone Encounter (Signed)
Sent to the pharmacy by e-scribe. 

## 2014-01-25 NOTE — Telephone Encounter (Signed)
Medication sent to the pharmacy.

## 2014-06-07 ENCOUNTER — Other Ambulatory Visit: Payer: Self-pay | Admitting: Internal Medicine

## 2014-06-08 ENCOUNTER — Encounter: Payer: Self-pay | Admitting: Family Medicine

## 2014-06-08 NOTE — Telephone Encounter (Signed)
Sent to the pharmacy by e-scribe.  Pt is now due for yearly wellness.  Will send a letter for her to make an appt.

## 2014-07-20 ENCOUNTER — Telehealth: Payer: Self-pay | Admitting: Internal Medicine

## 2014-07-20 NOTE — Telephone Encounter (Signed)
Pt called she need to have some paper work filled out for special accommodations to take a test. Does pt need an appt or can she drop off the paper work. She need it filled out by Jan. 1

## 2014-07-23 NOTE — Telephone Encounter (Signed)
Patient is scheduled for 07/26/14. She will callback if she can't take off work.

## 2014-07-23 NOTE — Telephone Encounter (Signed)
Would be more beneficial for the pt to have an office visit.  Is there any openings?

## 2014-07-26 ENCOUNTER — Ambulatory Visit: Payer: 59 | Admitting: Internal Medicine

## 2014-07-30 ENCOUNTER — Other Ambulatory Visit: Payer: Self-pay | Admitting: Internal Medicine

## 2014-07-30 ENCOUNTER — Ambulatory Visit (INDEPENDENT_AMBULATORY_CARE_PROVIDER_SITE_OTHER): Payer: 59 | Admitting: Internal Medicine

## 2014-07-30 VITALS — BP 110/66 | Temp 98.3°F | Wt 109.5 lb

## 2014-07-30 DIAGNOSIS — Z23 Encounter for immunization: Secondary | ICD-10-CM

## 2014-07-30 DIAGNOSIS — F419 Anxiety disorder, unspecified: Secondary | ICD-10-CM

## 2014-07-30 DIAGNOSIS — Z79899 Other long term (current) drug therapy: Secondary | ICD-10-CM
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
## 2014-07-30 NOTE — Patient Instructions (Addendum)
Continue med.   Agree with extended time as discussed. Advise get in with counselor also for   Reasons discussed .   Plan a wellness visit in then next 4-5 months
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
## 2014-07-31 NOTE — Telephone Encounter (Signed)
Ok to refill x 4 months  Until comes in for wellness visit

## 2014-08-01 NOTE — Telephone Encounter (Signed)
Sent to the pharmacy by e-scribe. 

## 2014-08-03 ENCOUNTER — Encounter: Payer: Self-pay | Admitting: Internal Medicine

## 2014-11-23 ENCOUNTER — Other Ambulatory Visit: Payer: Self-pay | Admitting: Internal Medicine

## 2014-11-23 ENCOUNTER — Telehealth: Payer: Self-pay | Admitting: Internal Medicine

## 2014-11-23 MED ORDER — NORETHIN ACE-ETH ESTRAD-FE 1.5-30 MG-MCG PO TABS: 1.0000 | ORAL_TABLET | Freq: Every day | ORAL | Status: DC

## 2014-11-23 NOTE — Telephone Encounter (Signed)
Pt request refill GILDESS FE 1.5/30 1.5-30 MG-MCG tablet Pt states rx refused.  Advised pt she needs appt.  Scheduled pt cpe on 5/11.   However pt states is out of bc pills and needs this weekend.

## 2014-11-23 NOTE — Telephone Encounter (Signed)
Sent to the pharmacy by e-scribe. 

## 2014-12-10 ENCOUNTER — Telehealth: Payer: Self-pay | Admitting: Internal Medicine

## 2014-12-10 NOTE — Telephone Encounter (Signed)
Pt called and has to reschedule her physical because of work. She will need a refill of her birth control pills in a couple of weeks. Do I need to work her in or just reschedule her in the next physical  slot and Dr Regis Bill will continue to rx her birth control until her physical

## 2014-12-10 NOTE — Telephone Encounter (Signed)
Ok to refill  ocps for 3 months   Get her in for wellness before runs out.

## 2014-12-11 ENCOUNTER — Other Ambulatory Visit: Payer: Self-pay | Admitting: Family Medicine

## 2014-12-11 DIAGNOSIS — Z0000 Encounter for general adult medical examination without abnormal findings: Secondary | ICD-10-CM

## 2014-12-11 MED ORDER — NORETHIN ACE-ETH ESTRAD-FE 1.5-30 MG-MCG PO TABS: 1.0000 | ORAL_TABLET | Freq: Every day | ORAL | Status: DC

## 2014-12-11 NOTE — Telephone Encounter (Signed)
lmovm to c/b and reschedule her physical

## 2014-12-11 NOTE — Telephone Encounter (Signed)
I sent in the bc for 3 months.  Please help her to make a lab and CPX appt before that prescription runs out.  I have placed the lab orders.  Thanks!

## 2014-12-12 ENCOUNTER — Encounter: Payer: Self-pay | Admitting: Internal Medicine

## 2015-02-15 ENCOUNTER — Other Ambulatory Visit: Payer: Self-pay | Admitting: Internal Medicine

## 2015-02-15 NOTE — Telephone Encounter (Signed)
30 day supply sent to the pharmacy by e-scribe.  She has a CPX scheduled on 02/20/15

## 2015-02-20 ENCOUNTER — Encounter: Payer: Self-pay | Admitting: Internal Medicine

## 2015-02-20 ENCOUNTER — Other Ambulatory Visit (HOSPITAL_COMMUNITY)
Admission: RE | Admit: 2015-02-20 | Discharge: 2015-02-20 | Disposition: A | Discharge status: Home / Self Care | Payer: 59 | Source: Ambulatory Visit | Attending: Internal Medicine | Admitting: Internal Medicine

## 2015-02-20 ENCOUNTER — Ambulatory Visit (INDEPENDENT_AMBULATORY_CARE_PROVIDER_SITE_OTHER): Payer: 59 | Admitting: Internal Medicine

## 2015-02-20 VITALS — BP 110/70 | Temp 98.3°F | Ht 62.0 in | Wt 110.5 lb

## 2015-02-20 DIAGNOSIS — Z79899 Other long term (current) drug therapy: Secondary | ICD-10-CM | POA: Diagnosis not present

## 2015-02-20 DIAGNOSIS — Z01411 Encounter for gynecological examination (general) (routine) with abnormal findings: Secondary | ICD-10-CM | POA: Diagnosis not present

## 2015-02-20 DIAGNOSIS — Z01419 Encounter for gynecological examination (general) (routine) without abnormal findings: Secondary | ICD-10-CM

## 2015-02-20 DIAGNOSIS — Z3041 Encounter for surveillance of contraceptive pills: Secondary | ICD-10-CM

## 2015-02-20 DIAGNOSIS — Z1151 Encounter for screening for human papillomavirus (HPV): Secondary | ICD-10-CM | POA: Diagnosis present

## 2015-02-20 DIAGNOSIS — Z113 Encounter for screening for infections with a predominantly sexual mode of transmission: Secondary | ICD-10-CM | POA: Diagnosis present

## 2015-02-20 DIAGNOSIS — Z0000 Encounter for general adult medical examination without abnormal findings: Secondary | ICD-10-CM | POA: Diagnosis not present

## 2015-02-20 DIAGNOSIS — D242 Benign neoplasm of left breast: Secondary | ICD-10-CM

## 2015-02-20 NOTE — Progress Notes (Signed)
Pre visit review using our clinic review tool, if applicable. No additional management support is needed unless otherwise documented below in the visit note.  Chief Complaint  Patient presents with  . Annual Exam    HPI: Patient  Michelle Patrick  24 y.o. comes in today for Preventive Health Care visit  Periods ok   Breast lump  Not dont yet .  Follow  HAs followed by neuro  On meds supression Anxiety continuing  Bid dosing seems to help some  Ocps no concerns no worseingif has cp sob  Cramps and periods  Engaged 1 partner  Health Maintenance  Topic Date Due  . INFLUENZA VACCINE  03/04/2015  . PAP SMEAR  02/19/2018  . TETANUS/TDAP  11/12/2021  . 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 GI /GU: No adominal pain, vomiting, change in bowel habits. No blood in the stool. No significant GU symptoms. SKIN/HEME: ,no acute skin rashes suspicious lesions or bleeding. No lymphadenopathy, nodules, masses.  NEURO/ PSYCH:  No neurologic signs such as weakness numbness. No depression anxiety. IMM/ Allergy: No unusual infections.  Allergy .   REST of 12 system review negative except as per HPI   Past Medical History  Diagnosis Date  . Dysmenorrhea     on ocps   . Recurrent UTI   . Anxiety   . Generalized headaches     has seen Dr Gaynell Face  now dr Jalene Mullet    No past surgical history on file.  No family history on file.  History   Social History  . Marital Status: Single    Spouse Name: N/A  . Number of Children: N/A  . Years of Education: N/A   Social History Main Topics  . Smoking status: Never Smoker    . Smokeless tobacco: Never Used  . Alcohol Use: No  . Drug Use: No  . Sexual Activity: Yes    Birth Control/ Protection: Pill   Other Topics Concern  . None   Social History Narrative   HH of 3  Moving in with fiance  2     Both parents smoke but not around her   Pet dogs and cats   Lives at home [place] in [place]    Works Product/process development scientist school   Went to [employer] for college    communtes to [employer] in May.     Just got engaged.   Got a  house.    40 hours      Outpatient Prescriptions Prior to Visit  Medication Sig Dispense Refill  . acyclovir (ZOVIRAX) 5 % ointment Apply topically every 3 (three) hours.      Marland Kitchen eletriptan (RELPAX) 40 MG tablet One tablet by mouth as needed for migraine headache.  If the headache improves and then returns, dose may be repeated after 2 hours have elapsed since first dose (do not exceed 80 mg per day). may repeat in 2 hours if necessary     . FLUoxetine (PROZAC) 20 MG capsule TAKE 1 CAPSULE BY MOUTH TWICE  DAILY 60 capsule 0  . naratriptan (AMERGE) 2.5 MG tablet     . norethindrone-ethinyl estradiol-iron (GILDESS FE 1.5/30) 1.5-30 MG-MCG tablet Take 1 tablet by mouth daily. 28 tablet 2  . topiramate (TOPAMAX) 50 MG tablet Take 75 mg by mouth daily.     . valACYclovir (VALTREX) 500 MG tablet Take 2 tablets (1,000 mg total) by mouth 2 (two) times daily. 30 tablet 2   No facility-administered medications prior to visit.     EXAM:  BP 110/70 mmHg  Temp(Src) 98.3 F (36.8 C) (Oral)  Ht _0  (1.575 m)  Wt 110 lb 8 oz (50.122 kg)  BMI 20.21 kg/m2  LMP 02/12/2015  Body mass index is 20.21 kg/(m^2).  Physical Exam: Vital signs reviewed EPP:IRJJ is a well-developed well-nourished alert cooperative    who appearsr stated age in no acute distress.  HEENT: normocephalic atraumatic , Eyes: PERRL EOM's full, conjunctiva clear, Nares: paten,t no deformity discharge or tenderness., Ears: no deformity  EAC's clear TMs with normal landmarks. Mouth: clear OP, no lesions, edema.  Moist mucous membranes. Dentition in adequate repair. NECK: supple without masses, thyromegaly or bruits. CHEST/PULM:  Clear to auscultation and percussion breath sounds equal no wheeze , rales or rhonchi. No chest wall deformities or tenderness. CV: PMI is nondisplaced, S1 S2 no gallops, murmurs, rubs. Peripheral pulses are full without delay.No JVD . Breast mumpiness   On left Noted no dc pt feels no change ABDOMEN: Bowel sounds normal nontender  No guard or rebound, no hepato splenomegal no CVA tenderness.  No hernia. Extremtities:  No clubbing cyanosis or edema, no acute joint swelling or redness no focal atrophy NEURO:  Oriented x3, cranial nerves 3-12 appear to be intact, no obvious focal weakness,gait within normal limits no abnormal reflexes or asymmetrical SKIN: No acute rashes normal turgor, color, no bruising or petechiae. PSYCH: Oriented, good eye contact, no obvious depression anxiety, cognition and judgment appear normal. LN: no cervical axillary inguinal adenopathy Pelvic: NL ext GU, labia clear without lesions or rash . Vagina no lesions .Cervix: clear  UTERUS: Neg CMT Adnexa:  clear no masses . PAP done chl screen    Lab Results  Component Value Date   WBC 10.3 11/04/2011   HGB 14.4 11/04/2011   HCT 42.5 11/04/2011   PLT 270.0 11/04/2011   GLUCOSE 84 11/04/2011   CHOL 173 11/04/2011   TRIG 115.0 11/04/2011   HDL 67.70 11/04/2011   LDLCALC 82 11/04/2011   ALT 19 11/04/2011   AST 19 11/04/2011   NA 139 11/04/2011   K 3.7 11/04/2011   CL 104 11/04/2011   CREATININE 1.0 11/04/2011   BUN 22 11/04/2011   CO2 24 11/04/2011   TSH 0.75 11/04/2011    ASSESSMENT AND PLAN:  Discussed the following assessment and plan:  Visit for preventive health examination  Encounter for routine gynecological examination - Plan: PAP [[place]]  Oral contraceptive pill surveillance  Medication management  - for anxiety disorder stable   Benign neoplasm of breast, left muoltiple fibroadenomas  Patient Care Team: Burnis Medin, MD as PCP - General Jodi Geralds, MD (Neurology) Orie Rout, MD as Referring Physician (Specialist) Patient Instructions  Continue lifestyle intervention healthy eating and exercise . Will notify you when pap results are available.  Preventive Care for Adults A healthy lifestyle and preventive care can promote health and wellness. Preventive health guidelines for women include the following key practices.  A routine yearly physical is a good way to check  with your health care provider about your health and preventive screening. It is a chance to share any concerns and updates on your health and to receive a thorough exam.  Visit your dentist for a routine exam and preventive care every 6 months. Brush your teeth twice a day and floss once a day. Good oral hygiene prevents tooth decay and gum disease.  The frequency of eye exams is based on your age, health, family medical history, use of contact lenses, and other factors. Follow your health care provider's recommendations for frequency of eye exams.  Eat a healthy diet. Foods like vegetables, fruits, whole grains, low-fat dairy products, and lean protein foods contain the nutrients you need without too many calories. Decrease your intake of foods high in solid fats, added sugars, and salt. Eat the right amount of calories for you.Get information about a proper diet from your health care provider, if necessary.  Regular physical exercise is one of the most important things you can do for your health. Most adults should get at least 150 minutes of moderate-intensity exercise (any activity that increases your heart rate and causes you to sweat) each week. In addition, most adults need muscle-strengthening exercises on 2 or more days a week.  Maintain a healthy weight. The body mass index (BMI) is a screening  tool to identify possible weight problems. It provides an estimate of body fat based on height and weight. Your health care provider can find your BMI and can help you achieve or maintain a healthy weight.For adults 20 years and older:  A BMI below 18.5 is considered underweight.  A BMI of 18.5 to 24.9 is normal.  A BMI of 25 to 29.9 is considered overweight.  A BMI of 30 and above is considered obese.  Maintain normal blood lipids and cholesterol levels by exercising and minimizing your intake of saturated fat. Eat a balanced diet with plenty of fruit and vegetables. Blood tests for lipids and cholesterol should begin at age 1 and be repeated every 5 years. If your lipid or cholesterol levels are high, you are over 50, or you are at high risk for heart disease, you may need your cholesterol levels checked more frequently.Ongoing high lipid and cholesterol levels should be treated with medicines if diet and exercise are not working.  If you smoke, find out from your health care provider how to quit. If you do not use tobacco, do not start.  Lung cancer screening is recommended for adults aged 27-80 years who are at high risk for developing lung cancer because of a history of smoking. A yearly low-dose CT scan of the lungs is recommended for people who have at least a 30-pack-year history of smoking and are a current smoker or have quit within the past 15 years. A pack year of smoking is smoking an average of 1 pack of cigarettes a day for 1 year (for example: 1 pack a day for 30 years or 2 packs a day for 15 years). Yearly screening should continue until the smoker has stopped smoking for at least 15 years. Yearly screening should be stopped for people who develop a health problem that would prevent them from having lung cancer treatment.  If you are pregnant, do not drink alcohol. If you are breastfeeding, be very cautious about drinking alcohol. If you are not pregnant and choose to drink  alcohol, do not have more than 1 drink per day. One drink is considered to be 12 ounces (355 mL)  of beer, 5 ounces (148 mL) of wine, or 1.5 ounces (44 mL) of liquor.  Avoid use of street drugs. Do not share needles with anyone. Ask for help if you need support or instructions about stopping the use of drugs.  High blood pressure causes heart disease and increases the risk of stroke. Your blood pressure should be checked at least every 1 to 2 years. Ongoing high blood pressure should be treated with medicines if weight loss and exercise do not work.  If you are 59-98 years old, ask your health care provider if you should take aspirin to prevent strokes.  Diabetes screening involves taking a blood sample to check your fasting blood sugar level. This should be done once every 3 years, after age 25, if you are within normal weight and without risk factors for diabetes. Testing should be considered at a younger age or be carried out more frequently if you are overweight and have at least 1 risk factor for diabetes.  Breast cancer screening is essential preventive care for women. You should practice "breast self-awareness." This means understanding the normal appearance and feel of your breasts and may include breast self-examination. Any changes detected, no matter how small, should be reported to a health care provider. Women in their 39s and 30s should have a clinical breast exam (CBE) by a health care provider as part of a regular health exam every 1 to 3 years. After age 70, women should have a CBE every year. Starting at age 53, women should consider having a mammogram (breast X-ray test) every year. Women who have a family history of breast cancer should talk to their health care provider about genetic screening. Women at a high risk of breast cancer should talk to their health care providers about having an MRI and a mammogram every year.  Breast cancer gene (BRCA)-related cancer risk assessment is  recommended for women who have family members with BRCA-related cancers. BRCA-related cancers include breast, ovarian, tubal, and peritoneal cancers. Having family members with these cancers may be associated with an increased risk for harmful changes (mutations) in the breast cancer genes BRCA1 and BRCA2. Results of the assessment will determine the need for genetic counseling and BRCA1 and BRCA2 testing.  Routine pelvic exams to screen for cancer are no longer recommended for nonpregnant women who are considered low risk for cancer of the pelvic organs (ovaries, uterus, and vagina) and who do not have symptoms. Ask your health care provider if a screening pelvic exam is right for you.  If you have had past treatment for cervical cancer or a condition that could lead to cancer, you need Pap tests and screening for cancer for at least 20 years after your treatment. If Pap tests have been discontinued, your risk factors (such as having a new sexual partner) need to be reassessed to determine if screening should be resumed. Some women have medical problems that increase the chance of getting cervical cancer. In these cases, your health care provider may recommend more frequent screening and Pap tests.  The HPV test is an additional test that may be used for cervical cancer screening. The HPV test looks for the virus that can cause the cell changes on the cervix. The cells collected during the Pap test can be tested for HPV. The HPV test could be used to screen women aged 67 years and older, and should be used in women of any age who have unclear Pap test results. After the age of  67, women should have HPV testing at the same frequency as a Pap test.  Colorectal cancer can be detected and often prevented. Most routine colorectal cancer screening begins at the age of 33 years and continues through age 27 years. However, your health care provider may recommend screening at an earlier age if you have risk factors  for colon cancer. On a yearly basis, your health care provider may provide home test kits to check for hidden blood in the stool. Use of a small camera at the end of a tube, to directly examine the colon (sigmoidoscopy or colonoscopy), can detect the earliest forms of colorectal cancer. Talk to your health care provider about this at age 74, when routine screening begins. Direct exam of the colon should be repeated every 5-10 years through age 39 years, unless early forms of pre-cancerous polyps or small growths are found.  People who are at an increased risk for hepatitis B should be screened for this virus. You are considered at high risk for hepatitis B if:  You were born in a country where hepatitis B occurs often. Talk with your health care provider about which countries are considered high risk.  Your parents were born in a high-risk country and you have not received a shot to protect against hepatitis B (hepatitis B vaccine).  You have HIV or AIDS.  You use needles to inject street drugs.  You live with, or have sex with, someone who has hepatitis B.  You get hemodialysis treatment.  You take certain medicines for conditions like cancer, organ transplantation, and autoimmune conditions.  Hepatitis C blood testing is recommended for all people born from [birth year] through 1966 and any individual with known risks for hepatitis C.  Practice safe sex. Use condoms and avoid high-risk sexual practices to reduce the spread of sexually transmitted infections (STIs). STIs include gonorrhea, chlamydia, syphilis, trichomonas, herpes, HPV, and human immunodeficiency virus (HIV). Herpes, HIV, and HPV are viral illnesses that have no cure. They can result in disability, cancer, and death.  You should be screened for sexually transmitted illnesses (STIs) including gonorrhea and chlamydia if:  You are sexually active and are younger than 24 years.  You are older than 24 years and your health care  provider tells you that you are at risk for this type of infection.  Your sexual activity has changed since you were last screened and you are at an increased risk for chlamydia or gonorrhea. Ask your health care provider if you are at risk.  If you are at risk of being infected with HIV, it is recommended that you take a prescription medicine daily to prevent HIV infection. This is called preexposure prophylaxis (PrEP). You are considered at risk if:  You are a heterosexual woman, are sexually active, and are at increased risk for HIV infection.  You take drugs by injection.  You are sexually active with a partner who has HIV.  Talk with your health care provider about whether you are at high risk of being infected with HIV. If you choose to begin PrEP, you should first be tested for HIV. You should then be tested every 3 months for as long as you are taking PrEP.  Osteoporosis is a disease in which the bones lose minerals and strength with aging. This can result in serious bone fractures or breaks. The risk of osteoporosis can be identified using a bone density scan. Women ages 55 years and over and women at risk for fractures or  osteoporosis should discuss screening with their health care providers. Ask your health care provider whether you should take a calcium supplement or vitamin D to reduce the rate of osteoporosis.  Menopause can be associated with physical symptoms and risks. Hormone replacement therapy is available to decrease symptoms and risks. You should talk to your health care provider about whether hormone replacement therapy is right for you.  Use sunscreen. Apply sunscreen liberally and repeatedly throughout the day. You should seek shade when your shadow is shorter than you. Protect yourself by wearing long sleeves, pants, a wide-brimmed hat, and sunglasses year round, whenever you are outdoors.  Once a month, do a whole body skin exam, using a mirror to look at the skin on  your back. Tell your health care provider of new moles, moles that have irregular borders, moles that are larger than a pencil eraser, or moles that have changed in shape or color.  Stay current with required vaccines (immunizations).  Influenza vaccine. All adults should be immunized every year.  Tetanus, diphtheria, and acellular pertussis (Td, Tdap) vaccine. Pregnant women should receive 1 dose of Tdap vaccine during each pregnancy. The dose should be obtained regardless of the length of time since the last dose. Immunization is preferred during the 27th-36th week of gestation. An adult who has not previously received Tdap or who does not know her vaccine status should receive 1 dose of Tdap. This initial dose should be followed by tetanus and diphtheria toxoids (Td) booster doses every 10 years. Adults with an unknown or incomplete history of completing a 3-dose immunization series with Td-containing vaccines should begin or complete a primary immunization series including a Tdap dose. Adults should receive a Td booster every 10 years.  Varicella vaccine. An adult without evidence of immunity to varicella should receive 2 doses or a second dose if she has previously received 1 dose. Pregnant females who do not have evidence of immunity should receive the first dose after pregnancy. This first dose should be obtained before leaving the health care facility. The second dose should be obtained 4-8 weeks after the first dose.  Human papillomavirus (HPV) vaccine. Females aged 13-26 years who have not received the vaccine previously should obtain the 3-dose series. The vaccine is not recommended for use in pregnant females. However, pregnancy testing is not needed before receiving a dose. If a female is found to be pregnant after receiving a dose, no treatment is needed. In that case, the remaining doses should be delayed until after the pregnancy. Immunization is recommended for any person with an  immunocompromised condition through the age of 80 years if she did not get any or all doses earlier. During the 3-dose series, the second dose should be obtained 4-8 weeks after the first dose. The third dose should be obtained 24 weeks after the first dose and 16 weeks after the second dose.  Zoster vaccine. One dose is recommended for adults aged 83 years or older unless certain conditions are present.  Measles, mumps, and rubella (MMR) vaccine. Adults born before [birth year] generally are considered immune to measles and mumps. Adults born in [birth year] or later should have 1 or more doses of MMR vaccine unless there is a contraindication to the vaccine or there is laboratory evidence of immunity to each of the three diseases. A routine second dose of MMR vaccine should be obtained at least 28 days after the first dose for students attending postsecondary schools, health care workers, or international travelers. People who  received inactivated measles vaccine or an unknown type of measles vaccine during 1963-1967 should receive 2 doses of MMR vaccine. People who received inactivated mumps vaccine or an unknown type of mumps vaccine before 1979 and are at high risk for mumps infection should consider immunization with 2 doses of MMR vaccine. For females of childbearing age, rubella immunity should be determined. If there is no evidence of immunity, females who are not pregnant should be vaccinated. If there is no evidence of immunity, females who are pregnant should delay immunization until after pregnancy. Unvaccinated health care workers born before [birth year] who lack laboratory evidence of measles, mumps, or rubella immunity or laboratory confirmation of disease should consider measles and mumps immunization with 2 doses of MMR vaccine or rubella immunization with 1 dose of MMR vaccine.  Pneumococcal 13-valent conjugate (PCV13) vaccine. When indicated, a person who is uncertain of her immunization history and has no record  of immunization should receive the PCV13 vaccine. An adult aged 67 years or older who has certain medical conditions and has not been previously immunized should receive 1 dose of PCV13 vaccine. This PCV13 should be followed with a dose of pneumococcal polysaccharide (PPSV23) vaccine. The PPSV23 vaccine dose should be obtained at least 8 weeks after the dose of PCV13 vaccine. An adult aged 47 years or older who has certain medical conditions and previously received 1 or more doses of PPSV23 vaccine should receive 1 dose of PCV13. The PCV13 vaccine dose should be obtained 1 or more years after the last PPSV23 vaccine dose.  Pneumococcal polysaccharide (PPSV23) vaccine. When PCV13 is also indicated, PCV13 should be obtained first. All adults aged 39 years and older should be immunized. An adult younger than age 27 years who has certain medical conditions should be immunized. Any person who resides in a nursing home or long-term care facility should be immunized. An adult smoker should be immunized. People with an immunocompromised condition and certain other conditions should receive both PCV13 and PPSV23 vaccines. People with human immunodeficiency virus (HIV) infection should be immunized as soon as possible after diagnosis. Immunization during chemotherapy or radiation therapy should be avoided. Routine use of PPSV23 vaccine is not recommended for American Indians, [place] Natives, or people younger than 65 years unless there are medical conditions that require PPSV23 vaccine. When indicated, people who have unknown immunization and have no record of immunization should receive PPSV23 vaccine. One-time revaccination 5 years after the first dose of PPSV23 is recommended for people aged 19-64 years who have chronic kidney failure, nephrotic syndrome, asplenia, or immunocompromised conditions. People who received 1-2 doses of PPSV23 before age 30 years should receive another dose of PPSV23 vaccine at age 58 years or  later if at least 5 years have passed since the previous dose. Doses of PPSV23 are not needed for people immunized with PPSV23 at or after age 38 years.  Meningococcal vaccine. Adults with asplenia or persistent complement component deficiencies should receive 2 doses of quadrivalent meningococcal conjugate (MenACWY-D) vaccine. The doses should be obtained at least 2 months apart. Microbiologists working with certain meningococcal bacteria, [place] recruits, people at risk during an outbreak, and people who travel to or live in countries with a high rate of meningitis should be immunized. A first-year college student up through age 7 years who is living in a residence hall should receive a dose if she did not receive a dose on or after her 16th birthday. Adults who have certain high-risk conditions should receive one or  more doses of vaccine.  Hepatitis A vaccine. Adults who wish to be protected from this disease, have certain high-risk conditions, work with hepatitis A-infected animals, work in hepatitis A research labs, or travel to or work in countries with a high rate of hepatitis A should be immunized. Adults who were previously unvaccinated and who anticipate close contact with an international adoptee during the first 60 days after arrival in the [place] from a country with a high rate of hepatitis A should be immunized.  Hepatitis B vaccine. Adults who wish to be protected from this disease, have certain high-risk conditions, may be exposed to blood or other infectious body fluids, are household contacts or sex partners of hepatitis B positive people, are clients or workers in certain care facilities, or travel to or work in countries with a high rate of hepatitis B should be immunized.  Haemophilus influenzae type b (Hib) vaccine. A previously unvaccinated person with asplenia or sickle cell disease or having a scheduled splenectomy should receive 1 dose of Hib vaccine. Regardless of  previous immunization, a recipient of a hematopoietic stem cell transplant should receive a 3-dose series 6-12 months after her successful transplant. Hib vaccine is not recommended for adults with HIV infection. Preventive Services / Frequency Ages 106 to 68 years  Blood pressure check.** / Every 1 to 2 years.  Lipid and cholesterol check.** / Every 5 years beginning at age 39.  Clinical breast exam.** / Every 3 years for women in their 68s and 44s.  BRCA-related cancer risk assessment.** / For women who have family members with a BRCA-related cancer (breast, ovarian, tubal, or peritoneal cancers).  Pap test.** / Every 2 years from ages 5 through 28. Every 3 years starting at age 70 through age 62 or 71 with a history of 3 consecutive normal Pap tests.  HPV screening.** / Every 3 years from ages 28 through ages 9 to 10 with a history of 3 consecutive normal Pap tests.  Hepatitis C blood test.** / For any individual with known risks for hepatitis C.  Skin self-exam. / Monthly.  Influenza vaccine. / Every year.  Tetanus, diphtheria, and acellular pertussis (Tdap, Td) vaccine.** / Consult your health care provider. Pregnant women should receive 1 dose of Tdap vaccine during each pregnancy. 1 dose of Td every 10 years.  Varicella vaccine.** / Consult your health care provider. Pregnant females who do not have evidence of immunity should receive the first dose after pregnancy.  HPV vaccine. / 3 doses over 6 months, if 55 and younger. The vaccine is not recommended for use in pregnant females. However, pregnancy testing is not needed before receiving a dose.  Measles, mumps, rubella (MMR) vaccine.** / You need at least 1 dose of MMR if you were born in 1957 or later. You may also need a 2nd dose. For females of childbearing age, rubella immunity should be determined. If there is no evidence of immunity, females who are not pregnant should be vaccinated. If there is no evidence of immunity,  females who are pregnant should delay immunization until after pregnancy.  Pneumococcal 13-valent conjugate (PCV13) vaccine.** / Consult your health care provider.  Pneumococcal polysaccharide (PPSV23) vaccine.** / 1 to 2 doses if you smoke cigarettes or if you have certain conditions.  Meningococcal vaccine.** / 1 dose if you are age 93 to 58 years and a Market researcher living in a residence hall, or have one of several medical conditions, you need to get vaccinated against meningococcal  disease. You may also need additional booster doses.  Hepatitis A vaccine.** / Consult your health care provider.  Hepatitis B vaccine.** / Consult your health care provider.  Haemophilus influenzae type b (Hib) vaccine.** / Consult your health care provider. Ages 45 to 1 years  Blood pressure check.** / Every 1 to 2 years.  Lipid and cholesterol check.** / Every 5 years beginning at age 86 years.  Lung cancer screening. / Every year if you are aged 15-80 years and have a 30-pack-year history of smoking and currently smoke or have quit within the past 15 years. Yearly screening is stopped once you have quit smoking for at least 15 years or develop a health problem that would prevent you from having lung cancer treatment.  Clinical breast exam.** / Every year after age 80 years.  BRCA-related cancer risk assessment.** / For women who have family members with a BRCA-related cancer (breast, ovarian, tubal, or peritoneal cancers).  Mammogram.** / Every year beginning at age 82 years and continuing for as long as you are in good health. Consult with your health care provider.  Pap test.** / Every 3 years starting at age 66 years through age 35 or 70 years with a history of 3 consecutive normal Pap tests.  HPV screening.** / Every 3 years from ages 59 years through ages 46 to 37 years with a history of 3 consecutive normal Pap tests.  Fecal occult blood test (FOBT) of stool. / Every year  beginning at age 5 years and continuing until age 52 years. You may not need to do this test if you get a colonoscopy every 10 years.  Flexible sigmoidoscopy or colonoscopy.** / Every 5 years for a flexible sigmoidoscopy or every 10 years for a colonoscopy beginning at age 58 years and continuing until age 69 years.  Hepatitis C blood test.** / For all people born from [birth year] through 1966 and any individual with known risks for hepatitis C.  Skin self-exam. / Monthly.  Influenza vaccine. / Every year.  Tetanus, diphtheria, and acellular pertussis (Tdap/Td) vaccine.** / Consult your health care provider. Pregnant women should receive 1 dose of Tdap vaccine during each pregnancy. 1 dose of Td every 10 years.  Varicella vaccine.** / Consult your health care provider. Pregnant females who do not have evidence of immunity should receive the first dose after pregnancy.  Zoster vaccine.** / 1 dose for adults aged 26 years or older.  Measles, mumps, rubella (MMR) vaccine.** / You need at least 1 dose of MMR if you were born in 1957 or later. You may also need a 2nd dose. For females of childbearing age, rubella immunity should be determined. If there is no evidence of immunity, females who are not pregnant should be vaccinated. If there is no evidence of immunity, females who are pregnant should delay immunization until after pregnancy.  Pneumococcal 13-valent conjugate (PCV13) vaccine.** / Consult your health care provider.  Pneumococcal polysaccharide (PPSV23) vaccine.** / 1 to 2 doses if you smoke cigarettes or if you have certain conditions.  Meningococcal vaccine.** / Consult your health care provider.  Hepatitis A vaccine.** / Consult your health care provider.  Hepatitis B vaccine.** / Consult your health care provider.  Haemophilus influenzae type b (Hib) vaccine.** / Consult your health care provider. Ages 41 years and over  Blood pressure check.** / Every 1 to 2 years.  Lipid and  cholesterol check.** / Every 5 years beginning at age 58 years.  Lung cancer screening. / Every year  if you are aged 26-80 years and have a 30-pack-year history of smoking and currently smoke or have quit within the past 15 years. Yearly screening is stopped once you have quit smoking for at least 15 years or develop a health problem that would prevent you from having lung cancer treatment.  Clinical breast exam.** / Every year after age 31 years.  BRCA-related cancer risk assessment.** / For women who have family members with a BRCA-related cancer (breast, ovarian, tubal, or peritoneal cancers).  Mammogram.** / Every year beginning at age 27 years and continuing for as long as you are in good health. Consult with your health care provider.  Pap test.** / Every 3 years starting at age 42 years through age 45 or 29 years with 3 consecutive normal Pap tests. Testing can be stopped between 65 and 70 years with 3 consecutive normal Pap tests and no abnormal Pap or HPV tests in the past 10 years.  HPV screening.** / Every 3 years from ages 30 years through ages 78 or 109 years with a history of 3 consecutive normal Pap tests. Testing can be stopped between 65 and 70 years with 3 consecutive normal Pap tests and no abnormal Pap or HPV tests in the past 10 years.  Fecal occult blood test (FOBT) of stool. / Every year beginning at age 66 years and continuing until age 60 years. You may not need to do this test if you get a colonoscopy every 10 years.  Flexible sigmoidoscopy or colonoscopy.** / Every 5 years for a flexible sigmoidoscopy or every 10 years for a colonoscopy beginning at age 73 years and continuing until age 68 years.  Hepatitis C blood test.** / For all people born from [birth year] through 1966 and any individual with known risks for hepatitis C.  Osteoporosis screening.** / A one-time screening for women ages 93 years and over and women at risk for fractures or osteoporosis.  Skin self-exam. /  Monthly.  Influenza vaccine. / Every year.  Tetanus, diphtheria, and acellular pertussis (Tdap/Td) vaccine.** / 1 dose of Td every 10 years.  Varicella vaccine.** / Consult your health care provider.  Zoster vaccine.** / 1 dose for adults aged 26 years or older.  Pneumococcal 13-valent conjugate (PCV13) vaccine.** / Consult your health care provider.  Pneumococcal polysaccharide (PPSV23) vaccine.** / 1 dose for all adults aged 8 years and older.  Meningococcal vaccine.** / Consult your health care provider.  Hepatitis A vaccine.** / Consult your health care provider.  Hepatitis B vaccine.** / Consult your health care provider.  Haemophilus influenzae type b (Hib) vaccine.** / Consult your health care provider. ** Family history and personal history of risk and conditions may change your health care provider's recommendations. Document Released: 09/15/2001 Document Revised: 12/04/2013 Document Reviewed: 12/15/2010 [facility] Patient Information 2015 [place], [place]. This information is not intended to replace advice given to you by your health care provider. Make sure you discuss any questions you have with your health care provider.     Standley Brooking. Monta Maiorana M.D.

## 2015-02-20 NOTE — Patient Instructions (Signed)
Continue lifestyle intervention healthy eating and exercise . Will notify you when pap results are available.  Preventive Care for Adults A healthy lifestyle and preventive care can promote health and wellness. Preventive health guidelines for women include the following key practices.  A routine yearly physical is a good way to check with your health care provider about your health and preventive screening. It is a chance to share any concerns and updates on your health and to receive a thorough exam.  Visit your dentist for a routine exam and preventive care every 6 months. Brush your teeth twice a day and floss once a day. Good oral hygiene prevents tooth decay and gum disease.  The frequency of eye exams is based on your age, health, family medical history, use of contact lenses, and other factors. Follow your health care provider's recommendations for frequency of eye exams.  Eat a healthy diet. Foods like vegetables, fruits, whole grains, low-fat dairy products, and lean protein foods contain the nutrients you need without too many calories. Decrease your intake of foods high in solid fats, added sugars, and salt. Eat the right amount of calories for you.Get information about a proper diet from your health care provider, if necessary.  Regular physical exercise is one of the most important things you can do for your health. Most adults should get at least 150 minutes of moderate-intensity exercise (any activity that increases your heart rate and causes you to sweat) each week. In addition, most adults need muscle-strengthening exercises on 2 or more days a week.  Maintain a healthy weight. The body mass index (BMI) is a screening tool to identify possible weight problems. It provides an estimate of body fat based on height and weight. Your health care provider can find your BMI and can help you achieve or maintain a healthy weight.For adults 20 years and older:  A BMI below 18.5 is  considered underweight.  A BMI of 18.5 to 24.9 is normal.  A BMI of 25 to 29.9 is considered overweight.  A BMI of 30 and above is considered obese.  Maintain normal blood lipids and cholesterol levels by exercising and minimizing your intake of saturated fat. Eat a balanced diet with plenty of fruit and vegetables. Blood tests for lipids and cholesterol should begin at age 54 and be repeated every 5 years. If your lipid or cholesterol levels are high, you are over 50, or you are at high risk for heart disease, you may need your cholesterol levels checked more frequently.Ongoing high lipid and cholesterol levels should be treated with medicines if diet and exercise are not working.  If you smoke, find out from your health care provider how to quit. If you do not use tobacco, do not start.  Lung cancer screening is recommended for adults aged 63-80 years who are at high risk for developing lung cancer because of a history of smoking. A yearly low-dose CT scan of the lungs is recommended for people who have at least a 30-pack-year history of smoking and are a current smoker or have quit within the past 15 years. A pack year of smoking is smoking an average of 1 pack of cigarettes a day for 1 year (for example: 1 pack a day for 30 years or 2 packs a day for 15 years). Yearly screening should continue until the smoker has stopped smoking for at least 15 years. Yearly screening should be stopped for people who develop a health problem that would prevent them from  having lung cancer treatment.  If you are pregnant, do not drink alcohol. If you are breastfeeding, be very cautious about drinking alcohol. If you are not pregnant and choose to drink alcohol, do not have more than 1 drink per day. One drink is considered to be 12 ounces (355 mL) of beer, 5 ounces (148 mL) of wine, or 1.5 ounces (44 mL) of liquor.  Avoid use of street drugs. Do not share needles with anyone. Ask for help if you need support or  instructions about stopping the use of drugs.  High blood pressure causes heart disease and increases the risk of stroke. Your blood pressure should be checked at least every 1 to 2 years. Ongoing high blood pressure should be treated with medicines if weight loss and exercise do not work.  If you are 45-46 years old, ask your health care provider if you should take aspirin to prevent strokes.  Diabetes screening involves taking a blood sample to check your fasting blood sugar level. This should be done once every 3 years, after age 54, if you are within normal weight and without risk factors for diabetes. Testing should be considered at a younger age or be carried out more frequently if you are overweight and have at least 1 risk factor for diabetes.  Breast cancer screening is essential preventive care for women. You should practice "breast self-awareness." This means understanding the normal appearance and feel of your breasts and may include breast self-examination. Any changes detected, no matter how small, should be reported to a health care provider. Women in their 75s and 30s should have a clinical breast exam (CBE) by a health care provider as part of a regular health exam every 1 to 3 years. After age 69, women should have a CBE every year. Starting at age 62, women should consider having a mammogram (breast X-ray test) every year. Women who have a family history of breast cancer should talk to their health care provider about genetic screening. Women at a high risk of breast cancer should talk to their health care providers about having an MRI and a mammogram every year.  Breast cancer gene (BRCA)-related cancer risk assessment is recommended for women who have family members with BRCA-related cancers. BRCA-related cancers include breast, ovarian, tubal, and peritoneal cancers. Having family members with these cancers may be associated with an increased risk for harmful changes (mutations) in  the breast cancer genes BRCA1 and BRCA2. Results of the assessment will determine the need for genetic counseling and BRCA1 and BRCA2 testing.  Routine pelvic exams to screen for cancer are no longer recommended for nonpregnant women who are considered low risk for cancer of the pelvic organs (ovaries, uterus, and vagina) and who do not have symptoms. Ask your health care provider if a screening pelvic exam is right for you.  If you have had past treatment for cervical cancer or a condition that could lead to cancer, you need Pap tests and screening for cancer for at least 20 years after your treatment. If Pap tests have been discontinued, your risk factors (such as having a new sexual partner) need to be reassessed to determine if screening should be resumed. Some women have medical problems that increase the chance of getting cervical cancer. In these cases, your health care provider may recommend more frequent screening and Pap tests.  The HPV test is an additional test that may be used for cervical cancer screening. The HPV test looks for the virus that  can cause the cell changes on the cervix. The cells collected during the Pap test can be tested for HPV. The HPV test could be used to screen women aged 48 years and older, and should be used in women of any age who have unclear Pap test results. After the age of 71, women should have HPV testing at the same frequency as a Pap test.  Colorectal cancer can be detected and often prevented. Most routine colorectal cancer screening begins at the age of 46 years and continues through age 1 years. However, your health care provider may recommend screening at an earlier age if you have risk factors for colon cancer. On a yearly basis, your health care provider may provide home test kits to check for hidden blood in the stool. Use of a small camera at the end of a tube, to directly examine the colon (sigmoidoscopy or colonoscopy), can detect the earliest forms  of colorectal cancer. Talk to your health care provider about this at age 73, when routine screening begins. Direct exam of the colon should be repeated every 5-10 years through age 47 years, unless early forms of pre-cancerous polyps or small growths are found.  People who are at an increased risk for hepatitis B should be screened for this virus. You are considered at high risk for hepatitis B if:  You were born in a country where hepatitis B occurs often. Talk with your health care provider about which countries are considered high risk.  Your parents were born in a high-risk country and you have not received a shot to protect against hepatitis B (hepatitis B vaccine).  You have HIV or AIDS.  You use needles to inject street drugs.  You live with, or have sex with, someone who has hepatitis B.  You get hemodialysis treatment.  You take certain medicines for conditions like cancer, organ transplantation, and autoimmune conditions.  Hepatitis C blood testing is recommended for all people born from [birth year] through 1965 and any individual with known risks for hepatitis C.  Practice safe sex. Use condoms and avoid high-risk sexual practices to reduce the spread of sexually transmitted infections (STIs). STIs include gonorrhea, chlamydia, syphilis, trichomonas, herpes, HPV, and human immunodeficiency virus (HIV). Herpes, HIV, and HPV are viral illnesses that have no cure. They can result in disability, cancer, and death.  You should be screened for sexually transmitted illnesses (STIs) including gonorrhea and chlamydia if:  You are sexually active and are younger than 24 years.  You are older than 24 years and your health care provider tells you that you are at risk for this type of infection.  Your sexual activity has changed since you were last screened and you are at an increased risk for chlamydia or gonorrhea. Ask your health care provider if you are at risk.  If you are at risk of  being infected with HIV, it is recommended that you take a prescription medicine daily to prevent HIV infection. This is called preexposure prophylaxis (PrEP). You are considered at risk if:  You are a heterosexual woman, are sexually active, and are at increased risk for HIV infection.  You take drugs by injection.  You are sexually active with a partner who has HIV.  Talk with your health care provider about whether you are at high risk of being infected with HIV. If you choose to begin PrEP, you should first be tested for HIV. You should then be tested every 3 months for as long as  you are taking PrEP.  Osteoporosis is a disease in which the bones lose minerals and strength with aging. This can result in serious bone fractures or breaks. The risk of osteoporosis can be identified using a bone density scan. Women ages 64 years and over and women at risk for fractures or osteoporosis should discuss screening with their health care providers. Ask your health care provider whether you should take a calcium supplement or vitamin D to reduce the rate of osteoporosis.  Menopause can be associated with physical symptoms and risks. Hormone replacement therapy is available to decrease symptoms and risks. You should talk to your health care provider about whether hormone replacement therapy is right for you.  Use sunscreen. Apply sunscreen liberally and repeatedly throughout the day. You should seek shade when your shadow is shorter than you. Protect yourself by wearing long sleeves, pants, a wide-brimmed hat, and sunglasses year round, whenever you are outdoors.  Once a month, do a whole body skin exam, using a mirror to look at the skin on your back. Tell your health care provider of new moles, moles that have irregular borders, moles that are larger than a pencil eraser, or moles that have changed in shape or color.  Stay current with required vaccines (immunizations).  Influenza vaccine. All adults  should be immunized every year.  Tetanus, diphtheria, and acellular pertussis (Td, Tdap) vaccine. Pregnant women should receive 1 dose of Tdap vaccine during each pregnancy. The dose should be obtained regardless of the length of time since the last dose. Immunization is preferred during the 27th-36th week of gestation. An adult who has not previously received Tdap or who does not know her vaccine status should receive 1 dose of Tdap. This initial dose should be followed by tetanus and diphtheria toxoids (Td) booster doses every 10 years. Adults with an unknown or incomplete history of completing a 3-dose immunization series with Td-containing vaccines should begin or complete a primary immunization series including a Tdap dose. Adults should receive a Td booster every 10 years.  Varicella vaccine. An adult without evidence of immunity to varicella should receive 2 doses or a second dose if she has previously received 1 dose. Pregnant females who do not have evidence of immunity should receive the first dose after pregnancy. This first dose should be obtained before leaving the health care facility. The second dose should be obtained 4-8 weeks after the first dose.  Human papillomavirus (HPV) vaccine. Females aged 13-26 years who have not received the vaccine previously should obtain the 3-dose series. The vaccine is not recommended for use in pregnant females. However, pregnancy testing is not needed before receiving a dose. If a female is found to be pregnant after receiving a dose, no treatment is needed. In that case, the remaining doses should be delayed until after the pregnancy. Immunization is recommended for any person with an immunocompromised condition through the age of 39 years if she did not get any or all doses earlier. During the 3-dose series, the second dose should be obtained 4-8 weeks after the first dose. The third dose should be obtained 24 weeks after the first dose and 16 weeks after  the second dose.  Zoster vaccine. One dose is recommended for adults aged 91 years or older unless certain conditions are present.  Measles, mumps, and rubella (MMR) vaccine. Adults born before [birth year] generally are considered immune to measles and mumps. Adults born in [birth year] or later should have 1 or more doses of MMR  vaccine unless there is a contraindication to the vaccine or there is laboratory evidence of immunity to each of the three diseases. A routine second dose of MMR vaccine should be obtained at least 28 days after the first dose for students attending postsecondary schools, health care workers, or international travelers. People who received inactivated measles vaccine or an unknown type of measles vaccine during 1963-1967 should receive 2 doses of MMR vaccine. People who received inactivated mumps vaccine or an unknown type of mumps vaccine before 1979 and are at high risk for mumps infection should consider immunization with 2 doses of MMR vaccine. For females of childbearing age, rubella immunity should be determined. If there is no evidence of immunity, females who are not pregnant should be vaccinated. If there is no evidence of immunity, females who are pregnant should delay immunization until after pregnancy. Unvaccinated health care workers born before [birth year] who lack laboratory evidence of measles, mumps, or rubella immunity or laboratory confirmation of disease should consider measles and mumps immunization with 2 doses of MMR vaccine or rubella immunization with 1 dose of MMR vaccine.  Pneumococcal 13-valent conjugate (PCV13) vaccine. When indicated, a person who is uncertain of her immunization history and has no record of immunization should receive the PCV13 vaccine. An adult aged 28 years or older who has certain medical conditions and has not been previously immunized should receive 1 dose of PCV13 vaccine. This PCV13 should be followed with a dose of pneumococcal polysaccharide (PPSV23)  vaccine. The PPSV23 vaccine dose should be obtained at least 8 weeks after the dose of PCV13 vaccine. An adult aged 47 years or older who has certain medical conditions and previously received 1 or more doses of PPSV23 vaccine should receive 1 dose of PCV13. The PCV13 vaccine dose should be obtained 1 or more years after the last PPSV23 vaccine dose.  Pneumococcal polysaccharide (PPSV23) vaccine. When PCV13 is also indicated, PCV13 should be obtained first. All adults aged 47 years and older should be immunized. An adult younger than age 83 years who has certain medical conditions should be immunized. Any person who resides in a nursing home or long-term care facility should be immunized. An adult smoker should be immunized. People with an immunocompromised condition and certain other conditions should receive both PCV13 and PPSV23 vaccines. People with human immunodeficiency virus (HIV) infection should be immunized as soon as possible after diagnosis. Immunization during chemotherapy or radiation therapy should be avoided. Routine use of PPSV23 vaccine is not recommended for American Indians, [place] Natives, or people younger than 65 years unless there are medical conditions that require PPSV23 vaccine. When indicated, people who have unknown immunization and have no record of immunization should receive PPSV23 vaccine. One-time revaccination 5 years after the first dose of PPSV23 is recommended for people aged 19-64 years who have chronic kidney failure, nephrotic syndrome, asplenia, or immunocompromised conditions. People who received 1-2 doses of PPSV23 before age 85 years should receive another dose of PPSV23 vaccine at age 57 years or later if at least 5 years have passed since the previous dose. Doses of PPSV23 are not needed for people immunized with PPSV23 at or after age 13 years.  Meningococcal vaccine. Adults with asplenia or persistent complement component deficiencies should receive 2 doses of  quadrivalent meningococcal conjugate (MenACWY-D) vaccine. The doses should be obtained at least 2 months apart. Microbiologists working with certain meningococcal bacteria, [place] recruits, people at risk during an outbreak, and people who travel to or live in  countries with a high rate of meningitis should be immunized. A first-year college student up through age 18 years who is living in a residence hall should receive a dose if she did not receive a dose on or after her 16th birthday. Adults who have certain high-risk conditions should receive one or more doses of vaccine.  Hepatitis A vaccine. Adults who wish to be protected from this disease, have certain high-risk conditions, work with hepatitis A-infected animals, work in hepatitis A research labs, or travel to or work in countries with a high rate of hepatitis A should be immunized. Adults who were previously unvaccinated and who anticipate close contact with an international adoptee during the first 60 days after arrival in the [place] from a country with a high rate of hepatitis A should be immunized.  Hepatitis B vaccine. Adults who wish to be protected from this disease, have certain high-risk conditions, may be exposed to blood or other infectious body fluids, are household contacts or sex partners of hepatitis B positive people, are clients or workers in certain care facilities, or travel to or work in countries with a high rate of hepatitis B should be immunized.  Haemophilus influenzae type b (Hib) vaccine. A previously unvaccinated person with asplenia or sickle cell disease or having a scheduled splenectomy should receive 1 dose of Hib vaccine. Regardless of previous immunization, a recipient of a hematopoietic stem cell transplant should receive a 3-dose series 6-12 months after her successful transplant. Hib vaccine is not recommended for adults with HIV infection. Preventive Services / Frequency Ages 75 to 75 years  Blood  pressure check.** / Every 1 to 2 years.  Lipid and cholesterol check.** / Every 5 years beginning at age 51.  Clinical breast exam.** / Every 3 years for women in their 54s and 36s.  BRCA-related cancer risk assessment.** / For women who have family members with a BRCA-related cancer (breast, ovarian, tubal, or peritoneal cancers).  Pap test.** / Every 2 years from ages 54 through 58. Every 3 years starting at age 54 through age 1 or 69 with a history of 3 consecutive normal Pap tests.  HPV screening.** / Every 3 years from ages 58 through ages 65 to 81 with a history of 3 consecutive normal Pap tests.  Hepatitis C blood test.** / For any individual with known risks for hepatitis C.  Skin self-exam. / Monthly.  Influenza vaccine. / Every year.  Tetanus, diphtheria, and acellular pertussis (Tdap, Td) vaccine.** / Consult your health care provider. Pregnant women should receive 1 dose of Tdap vaccine during each pregnancy. 1 dose of Td every 10 years.  Varicella vaccine.** / Consult your health care provider. Pregnant females who do not have evidence of immunity should receive the first dose after pregnancy.  HPV vaccine. / 3 doses over 6 months, if 44 and younger. The vaccine is not recommended for use in pregnant females. However, pregnancy testing is not needed before receiving a dose.  Measles, mumps, rubella (MMR) vaccine.** / You need at least 1 dose of MMR if you were born in 1957 or later. You may also need a 2nd dose. For females of childbearing age, rubella immunity should be determined. If there is no evidence of immunity, females who are not pregnant should be vaccinated. If there is no evidence of immunity, females who are pregnant should delay immunization until after pregnancy.  Pneumococcal 13-valent conjugate (PCV13) vaccine.** / Consult your health care provider.  Pneumococcal polysaccharide (PPSV23) vaccine.** /  1 to 2 doses if you smoke cigarettes or if you have certain  conditions.  Meningococcal vaccine.** / 1 dose if you are age 80 to 63 years and a Market researcher living in a residence hall, or have one of several medical conditions, you need to get vaccinated against meningococcal disease. You may also need additional booster doses.  Hepatitis A vaccine.** / Consult your health care provider.  Hepatitis B vaccine.** / Consult your health care provider.  Haemophilus influenzae type b (Hib) vaccine.** / Consult your health care provider. Ages 77 to 92 years  Blood pressure check.** / Every 1 to 2 years.  Lipid and cholesterol check.** / Every 5 years beginning at age 65 years.  Lung cancer screening. / Every year if you are aged 50-80 years and have a 30-pack-year history of smoking and currently smoke or have quit within the past 15 years. Yearly screening is stopped once you have quit smoking for at least 15 years or develop a health problem that would prevent you from having lung cancer treatment.  Clinical breast exam.** / Every year after age 2 years.  BRCA-related cancer risk assessment.** / For women who have family members with a BRCA-related cancer (breast, ovarian, tubal, or peritoneal cancers).  Mammogram.** / Every year beginning at age 85 years and continuing for as long as you are in good health. Consult with your health care provider.  Pap test.** / Every 3 years starting at age 55 years through age 41 or 79 years with a history of 3 consecutive normal Pap tests.  HPV screening.** / Every 3 years from ages 75 years through ages 53 to 77 years with a history of 3 consecutive normal Pap tests.  Fecal occult blood test (FOBT) of stool. / Every year beginning at age 59 years and continuing until age 70 years. You may not need to do this test if you get a colonoscopy every 10 years.  Flexible sigmoidoscopy or colonoscopy.** / Every 5 years for a flexible sigmoidoscopy or every 10 years for a colonoscopy beginning at age 97 years  and continuing until age 67 years.  Hepatitis C blood test.** / For all people born from [birth year] through 1965 and any individual with known risks for hepatitis C.  Skin self-exam. / Monthly.  Influenza vaccine. / Every year.  Tetanus, diphtheria, and acellular pertussis (Tdap/Td) vaccine.** / Consult your health care provider. Pregnant women should receive 1 dose of Tdap vaccine during each pregnancy. 1 dose of Td every 10 years.  Varicella vaccine.** / Consult your health care provider. Pregnant females who do not have evidence of immunity should receive the first dose after pregnancy.  Zoster vaccine.** / 1 dose for adults aged 72 years or older.  Measles, mumps, rubella (MMR) vaccine.** / You need at least 1 dose of MMR if you were born in 1957 or later. You may also need a 2nd dose. For females of childbearing age, rubella immunity should be determined. If there is no evidence of immunity, females who are not pregnant should be vaccinated. If there is no evidence of immunity, females who are pregnant should delay immunization until after pregnancy.  Pneumococcal 13-valent conjugate (PCV13) vaccine.** / Consult your health care provider.  Pneumococcal polysaccharide (PPSV23) vaccine.** / 1 to 2 doses if you smoke cigarettes or if you have certain conditions.  Meningococcal vaccine.** / Consult your health care provider.  Hepatitis A vaccine.** / Consult your health care provider.  Hepatitis B vaccine.** / Consult your  health care provider.  Haemophilus influenzae type b (Hib) vaccine.** / Consult your health care provider. Ages 75 years and over  Blood pressure check.** / Every 1 to 2 years.  Lipid and cholesterol check.** / Every 5 years beginning at age 45 years.  Lung cancer screening. / Every year if you are aged 66-80 years and have a 30-pack-year history of smoking and currently smoke or have quit within the past 15 years. Yearly screening is stopped once you have quit smoking  for at least 15 years or develop a health problem that would prevent you from having lung cancer treatment.  Clinical breast exam.** / Every year after age 42 years.  BRCA-related cancer risk assessment.** / For women who have family members with a BRCA-related cancer (breast, ovarian, tubal, or peritoneal cancers).  Mammogram.** / Every year beginning at age 68 years and continuing for as long as you are in good health. Consult with your health care provider.  Pap test.** / Every 3 years starting at age 4 years through age 69 or 75 years with 3 consecutive normal Pap tests. Testing can be stopped between 65 and 70 years with 3 consecutive normal Pap tests and no abnormal Pap or HPV tests in the past 10 years.  HPV screening.** / Every 3 years from ages 23 years through ages 67 or 65 years with a history of 3 consecutive normal Pap tests. Testing can be stopped between 65 and 70 years with 3 consecutive normal Pap tests and no abnormal Pap or HPV tests in the past 10 years.  Fecal occult blood test (FOBT) of stool. / Every year beginning at age 11 years and continuing until age 74 years. You may not need to do this test if you get a colonoscopy every 10 years.  Flexible sigmoidoscopy or colonoscopy.** / Every 5 years for a flexible sigmoidoscopy or every 10 years for a colonoscopy beginning at age 68 years and continuing until age 33 years.  Hepatitis C blood test.** / For all people born from [birth year] through 1966 and any individual with known risks for hepatitis C.  Osteoporosis screening.** / A one-time screening for women ages 67 years and over and women at risk for fractures or osteoporosis.  Skin self-exam. / Monthly.  Influenza vaccine. / Every year.  Tetanus, diphtheria, and acellular pertussis (Tdap/Td) vaccine.** / 1 dose of Td every 10 years.  Varicella vaccine.** / Consult your health care provider.  Zoster vaccine.** / 1 dose for adults aged 96 years or older.  Pneumococcal  13-valent conjugate (PCV13) vaccine.** / Consult your health care provider.  Pneumococcal polysaccharide (PPSV23) vaccine.** / 1 dose for all adults aged 68 years and older.  Meningococcal vaccine.** / Consult your health care provider.  Hepatitis A vaccine.** / Consult your health care provider.  Hepatitis B vaccine.** / Consult your health care provider.  Haemophilus influenzae type b (Hib) vaccine.** / Consult your health care provider. ** Family history and personal history of risk and conditions may change your health care provider's recommendations. Document Released: 09/15/2001 Document Revised: 12/04/2013 Document Reviewed: 12/15/2010 [facility] Patient Information 2015 [place], [place]. This information is not intended to replace advice given to you by your health care provider. Make sure you discuss any questions you have with your health care provider.

## 2015-02-22 LAB — CYTOLOGY - PAP

## 2015-02-28 ENCOUNTER — Telehealth: Payer: Self-pay | Admitting: Internal Medicine

## 2015-02-28 NOTE — Telephone Encounter (Signed)
Pt returning your call . pls cb °

## 2015-03-01 NOTE — Telephone Encounter (Signed)
Pt notified of results.  See result note. 

## 2015-03-03 DIAGNOSIS — Z3041 Encounter for surveillance of contraceptive pills: Secondary | ICD-10-CM | POA: Insufficient documentation

## 2015-03-03 NOTE — Assessment & Plan Note (Signed)
No change exam obvious and as per patient.

## 2015-03-11 ENCOUNTER — Telehealth: Payer: Self-pay | Admitting: Internal Medicine

## 2015-03-11 NOTE — Telephone Encounter (Signed)
Pt call to say that she was told that they were sending her pap off for more results. She call today to ask if those results were back

## 2015-03-11 NOTE — Telephone Encounter (Signed)
HPV is normal.  What next?  Referral to gyn?

## 2015-03-14 ENCOUNTER — Other Ambulatory Visit: Payer: Self-pay | Admitting: Internal Medicine

## 2015-03-15 NOTE — Telephone Encounter (Signed)
Pt notified will repeat in one year.

## 2015-03-15 NOTE — Telephone Encounter (Signed)
Sent to the pharmacy by e-scribe. 

## 2015-03-15 NOTE — Telephone Encounter (Signed)
No reason to refer .  Guidelines advise routine screen  In 3 years  However if wishes can do pap   At her next annual visit  .

## 2015-03-25 ENCOUNTER — Other Ambulatory Visit: Payer: Self-pay | Admitting: Internal Medicine

## 2015-03-26 NOTE — Telephone Encounter (Signed)
Sent to the pharmacy by e-scribe. 

## 2015-08-02 ENCOUNTER — Other Ambulatory Visit: Payer: Self-pay | Admitting: Internal Medicine

## 2015-08-02 NOTE — Telephone Encounter (Signed)
Sent to the pharmacy by e-scribe. 

## 2015-08-09 ENCOUNTER — Encounter: Payer: Self-pay | Admitting: Internal Medicine

## 2015-08-09 ENCOUNTER — Ambulatory Visit (INDEPENDENT_AMBULATORY_CARE_PROVIDER_SITE_OTHER)
Admission: RE | Admit: 2015-08-09 | Discharge: 2015-08-09 | Disposition: A | Discharge status: Home / Self Care | Payer: 59 | Source: Ambulatory Visit | Attending: Internal Medicine | Admitting: Internal Medicine

## 2015-08-09 ENCOUNTER — Ambulatory Visit (INDEPENDENT_AMBULATORY_CARE_PROVIDER_SITE_OTHER): Payer: 59 | Admitting: Internal Medicine

## 2015-08-09 VITALS — BP 120/80 | Temp 98.8°F | Ht 62.0 in | Wt 116.5 lb

## 2015-08-09 DIAGNOSIS — R109 Unspecified abdominal pain: Secondary | ICD-10-CM

## 2015-08-09 DIAGNOSIS — R0789 Other chest pain: Secondary | ICD-10-CM

## 2015-08-09 LAB — POCT URINALYSIS DIPSTICK
BILIRUBIN UA: NEGATIVE
Blood, UA: NEGATIVE
GLUCOSE UA: NEGATIVE
Leukocytes, UA: NEGATIVE
Nitrite, UA: NEGATIVE
Protein, UA: 15
Urobilinogen, UA: 1
pH, UA: 6

## 2015-08-09 MED ORDER — NAPROXEN SODIUM 550 MG PO TABS: 550.0000 mg | ORAL_TABLET | Freq: Two times a day (BID) | ORAL | Status: DC

## 2015-08-09 NOTE — Progress Notes (Signed)
Chief Complaint  Patient presents with  . right side pain    x 1 week; started a littel before menstural cycle     HPI: Patient Michelle Patrick  comes in today for SDA for  new problem evaluation.  2 weeks ago had really bad cold and couging for 3 weeks and then side started "killin g  Me   "  has seen   Urgent care   bethany  A week ago   Told pulled something and told to come back if not getting better in a week but decide dto come her for eval  ... records not available . Was given  Hydrocodone .   Poss tor cartilage   Splinting  wrapping   If not better   .     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  No fever cough now  Sob wheeze   No other rx taking pain med hs  Hard to work  ROS: See pertinent positives and negatives per HPI. No uti sx hematuria hx of trauma   Past Medical History  Diagnosis Date  . Dysmenorrhea     on ocps   . Recurrent UTI   . Anxiety   . Generalized headaches     has seen Dr Gaynell Face  now dr Jalene Mullet    No family history on file.  Social History   Social History  . Marital Status: Single    Spouse Name: N/A  . Number of Children: N/A  . Years of Education: N/A   Social History Main Topics  . Smoking status: Never Smoker   . Smokeless tobacco: Never Used  . Alcohol Use: No  . Drug Use: No  . Sexual Activity: Yes    Birth Control/ Protection: Pill   Other Topics Concern  . None   Social History Narrative   HH of 3  Moving in with fiance  2     Both parents smoke but not around her   Pet dogs and cats   Lives at home [place] in [place]    Works Product/process development scientist school   Went to [employer] for college    communtes to [employer] in May.     Just got engaged.   Got a  house.    40 hours      Outpatient Prescriptions Prior to Visit  Medication Sig Dispense Refill  . acyclovir (ZOVIRAX) 5 % ointment Apply topically every 3  (three) hours.      Marland Kitchen eletriptan (RELPAX) 40 MG tablet One tablet by mouth as needed for migraine headache.  If the headache improves and then returns, dose may be repeated after 2 hours have elapsed since first dose (do not exceed 80 mg per day). may repeat in 2 hours if necessary     . FLUoxetine (PROZAC) 20 MG capsule TAKE 1 CAPSULE BY MOUTH TWICE DAILY 60 capsule 5  . GILDESS FE 1.5/30 1.5-30 MG-MCG tablet TAKE 1 TABLET BY MOUTH ONCE DAILY 28 tablet 10  . naratriptan (AMERGE) 2.5 MG tablet     . topiramate (TOPAMAX) 50 MG tablet Take 75 mg by mouth daily.     . valACYclovir (VALTREX) 500 MG tablet TAKE TWO TABLETS BY MOUTH TWICE DAILY 30 tablet 2   No facility-administered medications prior to visit.  EXAM:  BP 120/80 mmHg  Temp(Src) 98.8 F (37.1 C) (Oral)  Ht 5\' 2"  (1.575 m)  Wt 116 lb 8 oz (52.844 kg)  BMI 21.30 kg/m2  LMP 08/02/2015  Body mass index is 21.3 kg/(m^2).  GENERAL: vitals reviewed and listed above, alert, oriented, appears well hydrated and in no acute distress HEENT: atraumatic, conjunctiva  clear, no obvious abnormalities on inspection of external nose and earsNECK: no obvious masses on inspection palpation  LUNGS: clear to auscultation bilaterally, no wheezes, rales or rhonchi, good air movement chest wall localized firm swelling at about T7 question costochondral junction no crepitus tender without redness negative chest wall squeeze. Sternum nontender. Chest exam is normal. CV: HRRR, no clubbing cyanosis or  peripheral edema nl cap refill  Abdomen:  Sof,t normal bowel sounds without hepatosplenomegaly, no guarding rebound or masses no CVA tenderness  MS: moves all extremities without noticeable focal  abnormality PSYCH: pleasant and cooperative, no obvious depression or anxiety Urinalysis specific gravity 10:30 negative leukocytes blood nitrites.   ASSESSMENT AND PLAN:  Discussed the following assessment and plan:  Acute chest wall pain - Plan: DG  Chest 2 View, DG Ribs Unilateral Right  Flank pain - Plan: POCT urinalysis dipstick, POCT urinalysis dipstick This really sounds chest wall whether it is a costochondral inflammation versus other. Exam is otherwise reassuring expectant management that this would hurt while get x-ray today rule out fracture atypical. Try anti-inflammatories Naprosyn Anaprox twice a day as needed for pain .  Follow-up in a week or 2 if needed realizing that it may take a month to feel better. -Patient advised to return or notify health care team  if symptoms worsen ,persist or new concerns arise.  Patient Instructions  This is just wall pain probably  Chest wall rib or cartilage joint  Strain injury .  Related to your coughing illness.  Get  X ray today . 2 check ribs and chest.  Chest wall injuries or strains may take 6-8 weeks to feel better however when she did try anti-inflammatory for pain. We'll send in naproxen twice a day for 1-2 weeks. Yes continue to splint with your hand consideration of seeing sports medicine for any other modalities to try to help with the discomfort. You lung exam is clear today .   Fu depending on x ray and how  You are doing  Contact us   If not getting any relief in  Another week    Chest Wall Pain Chest wall pain is pain in or around the bones and muscles of your chest. Sometimes, an injury causes this pain. Sometimes, the cause may not be known. This pain may take several weeks or longer to get better. HOME CARE INSTRUCTIONS  Pay attention to any changes in your symptoms. Take these actions to help with your pain:   Rest as told by your health care provider.   Avoid activities that cause pain. These include any activities that use your chest muscles or your abdominal and side muscles to lift heavy items.   If directed, apply ice to the painful area:  Put ice in a plastic bag.  Place a towel between your skin and the bag.  Leave the ice on for 20 minutes, 2-3 times  per day.  Take over-the-counter and prescription medicines only as told by your health care provider.  Do not use tobacco products, including cigarettes, chewing tobacco, and e-cigarettes. If you need help quitting, ask your health care provider.  Keep all  follow-up visits as told by your health care provider. This is important. SEEK MEDICAL CARE IF:  You have a fever.  Your chest pain becomes worse.  You have new symptoms. SEEK IMMEDIATE MEDICAL CARE IF:  You have nausea or vomiting.  You feel sweaty or light-headed.  You have a cough with phlegm (sputum) or you cough up blood.  You develop shortness of breath.   This information is not intended to replace advice given to you by your health care provider. Make sure you discuss any questions you have with your health care provider.   Document Released: 07/20/2005 Document Revised: 04/10/2015 Document Reviewed: 10/15/2014 Elsevier Interactive Patient Education 2016 [place]. Latonda Larrivee M.D.  Pre visit review using our clinic review tool, if applicable. No additional management support is needed unless otherwise documented below in the visit note.

## 2015-08-09 NOTE — Patient Instructions (Addendum)
This is just wall pain probably  Chest wall rib or cartilage joint  Strain injury .  Related to your coughing illness.  Get  X ray today . 2 check ribs and chest.  Chest wall injuries or strains may take 6-8 weeks to feel better however when she did try anti-inflammatory for pain. We'll send in naproxen twice a day for 1-2 weeks. Yes continue to splint with your hand consideration of seeing sports medicine for any other modalities to try to help with the discomfort. You lung exam is clear today .   Fu depending on x ray and how  You are doing  Contact us   If not getting any relief in  Another week    Chest Wall Pain Chest wall pain is pain in or around the bones and muscles of your chest. Sometimes, an injury causes this pain. Sometimes, the cause may not be known. This pain may take several weeks or longer to get better. HOME CARE INSTRUCTIONS  Pay attention to any changes in your symptoms. Take these actions to help with your pain:   Rest as told by your health care provider.   Avoid activities that cause pain. These include any activities that use your chest muscles or your abdominal and side muscles to lift heavy items.   If directed, apply ice to the painful area:  Put ice in a plastic bag.  Place a towel between your skin and the bag.  Leave the ice on for 20 minutes, 2-3 times per day.  Take over-the-counter and prescription medicines only as told by your health care provider.  Do not use tobacco products, including cigarettes, chewing tobacco, and e-cigarettes. If you need help quitting, ask your health care provider.  Keep all follow-up visits as told by your health care provider. This is important. SEEK MEDICAL CARE IF:  You have a fever.  Your chest pain becomes worse.  You have new symptoms. SEEK IMMEDIATE MEDICAL CARE IF:  You have nausea or vomiting.  You feel sweaty or light-headed.  You have a cough with phlegm (sputum) or you cough up blood.  You  develop shortness of breath.   This information is not intended to replace advice given to you by your health care provider. Make sure you discuss any questions you have with your health care provider.   Document Released: 07/20/2005 Document Revised: 04/10/2015 Document Reviewed: 10/15/2014 Elsevier Interactive Patient Education [employer].

## 2015-08-31 ENCOUNTER — Other Ambulatory Visit: Payer: Self-pay | Admitting: Internal Medicine

## 2015-11-01 ENCOUNTER — Ambulatory Visit (INDEPENDENT_AMBULATORY_CARE_PROVIDER_SITE_OTHER): Payer: 59 | Admitting: Family Medicine

## 2015-11-01 ENCOUNTER — Encounter: Payer: Self-pay | Admitting: Family Medicine

## 2015-11-01 VITALS — BP 130/72 | HR 79 | Temp 97.7°F | Wt 118.0 lb

## 2015-11-01 DIAGNOSIS — J069 Acute upper respiratory infection, unspecified: Secondary | ICD-10-CM

## 2015-11-01 DIAGNOSIS — J029 Acute pharyngitis, unspecified: Secondary | ICD-10-CM | POA: Diagnosis not present

## 2015-11-01 LAB — POCT RAPID STREP A (OFFICE): Rapid Strep A Screen: NEGATIVE

## 2015-11-01 NOTE — Patient Instructions (Addendum)
Viral upper respiratory infection with viral sore throat Results for orders placed or performed in visit on 11/01/15 (from the past 24 hour(s))  POC Rapid Strep A     Status: None   Collection Time: 11/01/15  5:02 PM  Result Value Ref Range   Rapid Strep A Screen Negative Negative   I would get lots of rest and drink lots of water this weekend Schedule Ibuprofen  400mg  every 6 hours for next 2-3 days until feeling better (this is to help the throat)  If you have fever or chills or symptoms that last through next Friday, return to see me.   I have changed you over to be my patient. Glad to have you!

## 2015-11-01 NOTE — Progress Notes (Signed)
PCP: Garret Reddish, MD  Subjective:  Michelle Patrick is a 25 y.o. year old very pleasant female patient who presents with Upper Respiratory infection symptoms.   Started Wednesday with sore throat, body aches, headache, mild cough. Wakes up with eyes crusted over but do not drain in daytime. Has tried some throat spray. Coricidin HBP (tylenol and chlorpheniramine) and only helped some. Some ear itching. Symptoms are not improving yet, stable. No flu exposure ROS- subjective fever, no chills, shortness of breath. No sinus pain or pressure. No nausea/vomiting  Pertinent Past Medical History-  Patient Active Problem List   Diagnosis Date Noted  . Generalized headaches     Priority: Medium  . Anxiety disorder 11/08/2011    Priority: Medium  . H/O herpes labialis 11/08/2011    Priority: Medium  . Breakthrough bleeding on OCPs 11/08/2011    Priority: Low  . Oral contraceptive use 11/08/2011    Priority: Low  . 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 UTI'S, RECURRENT 06/16/2007    Priority: Low    Medications- reviewed  Current Outpatient Prescriptions  Medication Sig Dispense Refill  . acyclovir (ZOVIRAX) 5 % ointment Apply topically every 3 (three) hours.      Marland Kitchen FLUoxetine (PROZAC) 20 MG capsule TAKE 1 CAPSULE BY MOUTH TWICE DAILY 60 capsule 5  . GILDESS FE 1.5/30 1.5-30 MG-MCG tablet TAKE 1 TABLET BY MOUTH ONCE DAILY 28 tablet 10  . naratriptan (AMERGE) 2.5 MG tablet     . topiramate (TOPAMAX) 50 MG tablet Take 75 mg by mouth daily.     . valACYclovir (VALTREX) 500 MG tablet TAKE TWO TABLETS BY MOUTH TWICE DAILY 30 tablet 2   No current facility-administered medications for this visit.    Objective: BP 130/72 mmHg  Pulse 79  Temp(Src) 97.7 F (36.5 C)  Wt 118 lb (53.524 kg) Gen: NAD, resting comfortably HEENT: Turbinates erythematous, TM normal, pharynx moderately erythematous with no tonsilar exudate or edema, no sinus tenderness Does have some tender  lymphadenopathy CV: RRR no murmurs rubs or gallops Lungs: CTAB no crackles, wheeze, rhonchi Abdomen: soft/nontender/nondistended/normal bowel sounds. No rebound or guarding.  Ext: no edema Skin: warm, dry, no rash Neuro: grossly normal, moves all extremities  Results for orders placed or performed in visit on 11/01/15 (from the past 24 hour(s))  POC Rapid Strep A     Status: None   Collection Time: 11/01/15  5:02 PM  Result Value Ref Range   Rapid Strep A Screen Negative Negative   Assessment/Plan:  Upper Respiratory infection- may primarily be viral pharyngitis History and exam today are suggestive of viral URI.  We discussed that a serious infection or illness is unlikely. We also discussed reasons why current illness does not meet criteria for bacterial illness and therefore no antibiotics indicated. Also educated on signs that bacterial infection may have developed. We discussed treatment side effects, likely course, antibiotic misuse, transmission, and signs of developing a serious illness.  Symptomatic treatment with: scheduled ibuprofen 400mg  every 6 hours  Finally, we reviewed reasons to return to care including if symptoms worsen or persist or new concerns arise.

## 2015-11-28 ENCOUNTER — Ambulatory Visit (INDEPENDENT_AMBULATORY_CARE_PROVIDER_SITE_OTHER): Payer: 59 | Admitting: Adult Health

## 2015-11-28 ENCOUNTER — Encounter: Payer: Self-pay | Admitting: Adult Health

## 2015-11-28 VITALS — BP 142/60 | Temp 98.0°F | Wt 118.8 lb

## 2015-11-28 DIAGNOSIS — M79642 Pain in left hand: Secondary | ICD-10-CM
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
## 2015-11-28 NOTE — Patient Instructions (Signed)
It was great meeting you today!  I am sorry this happened to you.   I will follow up with you regarding your x ray .   Continue to use Ice and if needed Tylenol or Motin

## 2015-11-29 ENCOUNTER — Ambulatory Visit (INDEPENDENT_AMBULATORY_CARE_PROVIDER_SITE_OTHER)
Admission: RE | Admit: 2015-11-29 | Discharge: 2015-11-29 | Disposition: A | Discharge status: Home / Self Care | Payer: 59 | Source: Ambulatory Visit | Attending: Adult Health | Admitting: Adult Health

## 2015-11-29 ENCOUNTER — Telehealth: Payer: Self-pay | Admitting: Family Medicine

## 2015-11-29 DIAGNOSIS — M79642 Pain in left hand: Secondary | ICD-10-CM | POA: Diagnosis not present

## 2015-11-29 NOTE — Telephone Encounter (Signed)
Patient is aware of results.

## 2015-11-29 NOTE — Telephone Encounter (Signed)
Pt saw [facility] yesterday and would like results of xray on hand. Please call back.

## 2016-01-13 ENCOUNTER — Other Ambulatory Visit: Payer: Self-pay | Admitting: Internal Medicine

## 2016-01-15 ENCOUNTER — Other Ambulatory Visit: Payer: Self-pay

## 2016-01-15 ENCOUNTER — Other Ambulatory Visit: Payer: Self-pay | Admitting: Internal Medicine

## 2016-01-15 ENCOUNTER — Other Ambulatory Visit: Payer: Self-pay | Admitting: Family Medicine

## 2016-01-15 ENCOUNTER — Telehealth: Payer: Self-pay | Admitting: Family Medicine

## 2016-01-15 DIAGNOSIS — Z0000 Encounter for general adult medical examination without abnormal findings: Secondary | ICD-10-CM

## 2016-01-15 MED ORDER — NORETHIN ACE-ETH ESTRAD-FE 1.5-30 MG-MCG PO TABS: 1.0000 | ORAL_TABLET | Freq: Every day | ORAL | Status: DC

## 2016-01-15 NOTE — Telephone Encounter (Signed)
Pt needs refill on junel bcp send to [facility]

## 2016-01-15 NOTE — Telephone Encounter (Signed)
Sent to the pharmacy by e-scribe.  Message sent to scheduling.  Now due for cpx.

## 2016-01-15 NOTE — Telephone Encounter (Signed)
Pt is now due for cpx and lab work.  I have placed the lab orders.  Please help the pt to make both appointments.  Thanks!

## 2016-01-16 NOTE — Telephone Encounter (Signed)
Michelle Patrick pt states she sees dr Retail banker as her PCP however she would like dr Regis Bill to do her pap. Please advise

## 2016-01-17 NOTE — Telephone Encounter (Signed)
I Do not  Do gyne exams for other PCPs .    She can disc with Dr. Yong Channel or get appt with a female gyne. Michelle Patrick

## 2016-01-20 NOTE — Telephone Encounter (Signed)
Pt would like cpx . Pt states dr hunter is now her PCP and not dr Regis Bill.can I create 30 min slot?

## 2016-01-20 NOTE — Telephone Encounter (Signed)
Pt is aware.  

## 2016-01-20 NOTE — Telephone Encounter (Signed)
Dr. Otelia Santee CPE. Rx has been sent in.

## 2016-01-22 NOTE — Telephone Encounter (Signed)
Pt has been sch

## 2016-02-10 ENCOUNTER — Ambulatory Visit (INDEPENDENT_AMBULATORY_CARE_PROVIDER_SITE_OTHER): Payer: 59 | Admitting: Family Medicine

## 2016-02-10 ENCOUNTER — Encounter: Payer: Self-pay | Admitting: Family Medicine

## 2016-02-10 VITALS — BP 124/76 | HR 105 | Temp 98.6°F | Ht 62.5 in | Wt 119.0 lb

## 2016-02-10 DIAGNOSIS — Z0001 Encounter for general adult medical examination with abnormal findings: Secondary | ICD-10-CM | POA: Diagnosis not present

## 2016-02-10 DIAGNOSIS — N644 Mastodynia: Secondary | ICD-10-CM

## 2016-02-10 DIAGNOSIS — R6889 Other general symptoms and signs: Secondary | ICD-10-CM

## 2016-02-10 MED ORDER — VALACYCLOVIR HCL 500 MG PO TABS: 1000.0000 mg | ORAL_TABLET | Freq: Two times a day (BID) | ORAL | Status: DC

## 2016-02-10 MED ORDER — NORETHIN ACE-ETH ESTRAD-FE 1.5-30 MG-MCG PO TABS: 1.0000 | ORAL_TABLET | Freq: Every day | ORAL | Status: DC

## 2016-02-10 NOTE — Progress Notes (Signed)
Phone: [phone]  Subjective:  Patient presents today for their annual physical. Chief complaint-noted.   See problem oriented charting- ROS- full  review of systems was completed and negative except for: right breast pain  The following were reviewed and entered/updated in epic: Past Medical History  Diagnosis Date  . Dysmenorrhea     on ocps   . Recurrent UTI   . Anxiety   . Generalized headaches     has seen Dr Gaynell Face  now dr Jalene Mullet   Patient Active Problem List   Diagnosis Date Noted  . Generalized headaches     Priority: Medium  . Anxiety disorder 11/08/2011    Priority: Medium  . H/O herpes labialis 11/08/2011    Priority: Medium  . Oral contraceptive use 11/08/2011    Priority: Low  . 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 UTI'S, RECURRENT 06/16/2007    Priority: Low   Past Surgical History  Procedure Laterality Date  . None      Family History  Problem Relation Age of Onset  . Diabetes Father   . Hypertension Father   . Hyperlipidemia Father   . Colon polyps Father     adenoma  . Other Mother     ulcerative proctosigmoiditis  . Diabetes Mother   . Hyperlipidemia Mother   . Hypertension Mother   . Anxiety disorder Mother     GAD    Medications- reviewed and updated Current Outpatient Prescriptions  Medication Sig Dispense Refill  . acyclovir (ZOVIRAX) 5 % ointment Apply topically every 3 (three) hours.      . naratriptan (AMERGE) 2.5 MG tablet     . norethindrone-ethinyl estradiol-iron (JUNEL FE 1.5/30) 1.5-30 MG-MCG tablet Take 1 tablet by mouth daily. 28 tablet 12  . valACYclovir (VALTREX) 500 MG tablet Take 2 tablets (1,000 mg total) by mouth 2 (two) times daily. For 7 days for cold sores 30 tablet 2   No current facility-administered medications for this visit.    Allergies-reviewed and updated No Known Allergies  Social History   Social History  . Marital Status: Single    Spouse Name: N/A  . Number of  Children: N/A  . Years of Education: N/A   Social History Main Topics  . Smoking status: Never Smoker   . Smokeless tobacco: Never Used  . Alcohol Use: 0.0 - 0.6 oz/week    0-1 Standard drinks or equivalent per week  . Drug Use: No  . Sexual Activity: Yes    Birth Control/ Protection: Pill   Other Topics Concern  . None   Social History Narrative   HH of 3- mom, dad.   Moving in with fiance eventually.        vet tech school   Went to [employer] for college  ([employer])   Works at [place] area. 12 hour shifts   Pets 2 cats.       Hobbies: enjoys going to the lake, sleep    Objective: BP 124/76 mmHg  Pulse 105  Temp(Src) 98.6 F (37 C) (Oral)  Ht 5' 2.5" (1.588 m)  Wt 119 lb (53.978 kg)  BMI 21.41 kg/m2  SpO2 96%  LMP 01/14/2016 (Approximate) Gen: NAD, resting comfortably HEENT: Mucous membranes are moist. Oropharynx normal Neck: no thyromegaly CV: RRR no murmurs rubs or gallops Lungs: CTAB no crackles, wheeze, rhonchi Abdomen: soft/nontender/nondistended/normal bowel sounds. No rebound or guarding.  Ext: no edema Skin: warm, dry Neuro: grossly normal, moves  all extremities, PERRLA  Patient declines breast exam. Discussed may have to return for this depending on breast center regulations/needs.   Assessment/Plan:  25 y.o. female presenting for annual physical.  Health Maintenance counseling: 1. Anticipatory guidance: Patient counseled regarding regular dental exams, eye exams, wearing seatbelts.  2. Risk factor reduction:  Advised patient of need for regular exercise and diet rich and fruits and vegetables to reduce risk of heart attack and stroke.  3. Immunizations/screenings/ancillary studies Immunization History  Administered Date(s) Administered  . Influenza Split 05/16/2012  . Influenza Whole 05/09/2009  . Influenza,inj,Quad PF,36+ Mos 07/30/2014  . Meningococcal Polysaccharide 05/09/2009  . PPD Test 11/13/2011  . Tdap  11/13/2011    4. Cervical cancer screening- ascus HPV negative- repeat COtesting 2019 5. Breast cancer screening-  breast exam declined but does want- states no palpable abnormalities (no rash on breast as well) mammogram diagnostic and ultrasound given right breast pain  6. Colon cancer screening - no family history, start at age 33  Status of chronic or acute concerns  Right breast pain seems to radiate into axilla. History benign fibroadenoma bilaterally. Would like diagnostic mammogram and ultrasound.   Palpitations 1-2x a week hard 3 beats then less. Can happen just while sitting.   Anxiety- has some but overall managing well despite no medication.   Generalized Headaches- amerge just as needed around periods- no longer on topamax and doing well except around period  Cold sores- valtrex prn  Birth control- periods regular on this. No protection- fiancee only. Cramping still reduced by medication  1-2 year CPE. Labs in 2018 or 2019. Pap 2019.   Orders Placed This Encounter  Procedures  . MM Digital Diagnostic Unilat R    Standing Status: Future     Number of Occurrences:      Standing Expiration Date: 04/12/2017    Order Specific Question:  Reason for Exam (SYMPTOM  OR DIAGNOSIS REQUIRED)    Answer:  right breast pain 9 o clock    Order Specific Question:  Is the patient pregnant?    Answer:  No    Order Specific Question:  Preferred imaging location?    Answer:  [facility]  . US BREAST LTD UNI RIGHT INC AXILLA    Standing Status: Future     Number of Occurrences:      Standing Expiration Date: 04/12/2017    Order Specific Question:  Reason for Exam (SYMPTOM  OR DIAGNOSIS REQUIRED)    Answer:  right sided breast pain around 9 o clock    Order Specific Question:  Preferred imaging location?    Answer:  [facility]    Meds ordered this encounter  Medications  . norethindrone-ethinyl estradiol-iron (JUNEL FE 1.5/30) 1.5-30 MG-MCG tablet    Sig: Take 1 tablet by  mouth daily.    Dispense:  28 tablet    Refill:  12  . valACYclovir (VALTREX) 500 MG tablet    Sig: Take 2 tablets (1,000 mg total) by mouth 2 (two) times daily. For 7 days for cold sores    Dispense:  30 tablet    Refill:  2   Return precautions advised.   Garret Reddish, MD

## 2016-02-10 NOTE — Addendum Note (Signed)
Addended by: Marin Olp on: 02/10/2016 06:27 PM   Modules accepted: Miquel Dunn

## 2016-02-10 NOTE — Patient Instructions (Signed)
We will call you within a week about your referral to breast center- you can also call I believe. If you do not hear within 2 weeks, give [place] a call.  Follow up in 1-2 years.in 2019 will need pap and probably reasonable to repeat labs by then.

## 2016-02-10 NOTE — Progress Notes (Signed)
Pre visit review using our clinic review tool, if applicable. No additional management support is needed unless otherwise documented below in the visit note. 

## 2016-02-13 ENCOUNTER — Encounter: Payer: Self-pay | Admitting: Family Medicine

## 2016-02-15 ENCOUNTER — Encounter: Payer: Self-pay | Admitting: Family Medicine

## 2016-02-16 MED ORDER — BUSPIRONE HCL 5 MG PO TABS: 5.0000 mg | ORAL_TABLET | Freq: Two times a day (BID) | ORAL | Status: DC | PRN

## 2016-04-08 ENCOUNTER — Ambulatory Visit
Admission: RE | Admit: 2016-04-08 | Discharge: 2016-04-08 | Disposition: A | Discharge status: Home / Self Care | Payer: 59 | Source: Ambulatory Visit | Attending: Family Medicine | Admitting: Family Medicine

## 2016-04-08 DIAGNOSIS — N644 Mastodynia: Secondary | ICD-10-CM

## 2016-10-19 ENCOUNTER — Telehealth: Payer: Self-pay | Admitting: Family Medicine

## 2016-10-19 ENCOUNTER — Other Ambulatory Visit: Payer: Self-pay

## 2016-10-19 MED ORDER — NORETHIN ACE-ETH ESTRAD-FE 1.5-30 MG-MCG PO TABS: 1.0000 | ORAL_TABLET | Freq: Every day | ORAL | 0 refills | Status: DC

## 2016-10-19 NOTE — Telephone Encounter (Signed)
Pt request a 90 day refill of norethindrone-ethinyl estradiol-iron (JUNEL FE 1.5/30) 1.5-30 MG-MCG tablet  Pt's insurance is now requiring 90 day.  [facility] [place] - [address]

## 2016-10-19 NOTE — Telephone Encounter (Signed)
Prescription sent to pharmacy as requested.

## 2016-12-04 ENCOUNTER — Encounter: Payer: Self-pay | Admitting: Family Medicine

## 2016-12-04 ENCOUNTER — Ambulatory Visit (INDEPENDENT_AMBULATORY_CARE_PROVIDER_SITE_OTHER): Payer: BLUE CROSS/BLUE SHIELD | Admitting: Family Medicine

## 2016-12-04 VITALS — BP 120/84 | HR 85 | Temp 98.3°F | Ht 62.5 in | Wt 115.3 lb

## 2016-12-04 DIAGNOSIS — K5900 Constipation, unspecified: Secondary | ICD-10-CM | POA: Diagnosis not present

## 2016-12-04 DIAGNOSIS — K649 Unspecified hemorrhoids: Secondary | ICD-10-CM

## 2016-12-04 MED ORDER — HYDROCORTISONE 2.5 % RE CREA: 1.0000 "application " | TOPICAL_CREAM | Freq: Two times a day (BID) | RECTAL | 0 refills | Status: DC

## 2016-12-04 NOTE — Progress Notes (Signed)
HPI:  Acute visit for:  Hemorrhoid? -started 3-4 days ago -symptoms: some pain and pruritis, noticed purple bulge  -has constipation "my whole life" -goes most day, but usually hard balls of stool and some straining at times -no bleeding, wait loss, change in bowels, melena, anal sex, anal trauma  ROS: See pertinent positives and negatives per HPI.  Past Medical History:  Diagnosis Date  . Anxiety   . Dysmenorrhea    on ocps   . Generalized headaches    has seen Dr Gaynell Face  now dr Jalene Mullet  . Recurrent UTI     Past Surgical History:  Procedure Laterality Date  . none      Family History  Problem Relation Age of Onset  . Diabetes Father   . Hypertension Father   . Hyperlipidemia Father   . Colon polyps Father     adenoma  . Other Mother     ulcerative proctosigmoiditis  . Diabetes Mother   . Hyperlipidemia Mother   . Hypertension Mother   . Anxiety disorder Mother     GAD    Social History   Social History  . Marital status: Single    Spouse name: N/A  . Number of children: N/A  . Years of education: N/A   Social History Main Topics  . Smoking status: Never Smoker  . Smokeless tobacco: Never Used  . Alcohol use 0.0 - 0.6 oz/week  . Drug use: No  . Sexual activity: Yes    Birth control/ protection: Pill   Other Topics Concern  . None   Social History Narrative   HH of 3- mom, dad.   Moving in with fiance eventually.        vet tech school   Went [employer] for college  ([employer])   Works at [place] area. 12 hour shifts   Pets 2 cats.       Hobbies: enjoys going to the lake, sleep     Current Outpatient Prescriptions:  .  acyclovir (ZOVIRAX) 5 % ointment, Apply topically every 3 (three) hours.  , Disp: , Rfl:  .  busPIRone (BUSPAR) 5 MG tablet, Take 1 tablet (5 mg total) by mouth 2 (two) times daily as needed., Disp: 60 tablet, Rfl: 5 .  naratriptan (AMERGE) 2.5 MG tablet, , Disp: , Rfl:  .   norethindrone-ethinyl estradiol-iron (JUNEL FE 1.5/30) 1.5-30 MG-MCG tablet, Take 1 tablet by mouth daily., Disp: 84 tablet, Rfl: 0 .  valACYclovir (VALTREX) 500 MG tablet, Take 2 tablets (1,000 mg total) by mouth 2 (two) times daily. For 7 days for cold sores, Disp: 30 tablet, Rfl: 2 .  hydrocortisone (ANUSOL-HC) 2.5 % rectal cream, Place 1 application rectally 2 (two) times daily., Disp: 30 g, Rfl: 0  EXAM:  Vitals:   12/04/16 1039  BP: 120/84  Pulse: 85  Temp: 98.3 F (36.8 C)    Body mass index is 20.75 kg/m.  GENERAL: vitals reviewed and listed above, alert, oriented, appears well hydrated and in no acute distress  HEENT: atraumatic, conjunttiva clear, no obvious abnormalities on inspection of external nose and ears  NECK: no obvious masses on inspection  LUNGS: clear to auscultation bilaterally, no wheezes, rales or rhonchi, good air movement  CV: HRRR, no peripheral edema  RECTUM: one small and one moderate sized hemorrhoid, non thrombosed, no bleeding  MS: moves all extremities without noticeable abnormality  PSYCH: pleasant and cooperative, no obvious depression or anxiety  ASSESSMENT AND  PLAN:  Discussed the following assessment and plan:  Hemorrhoids, unspecified hemorrhoid type  Constipation, unspecified constipation type  -anusol, bowel regimen, sitz baths, education and follow up as needed -she is young and is considering GI referral if persists or recurs -Patient advised to return or notify a doctor immediately if symptoms worsen or persist or new concerns arise.  Patient Instructions  Use the Anusol as prescribed.  Metameucil or Citracel every morning before breakfast in water.  Mirilax for 1-2 weeks at the first sign of hards stools or straining to keep stools soft and easy to pass.  Call or follow up if any persistent symptoms or recurrent issues.  I hope you are feeling better soon!   About Hemorrhoids  Hemorrhoids are swollen veins in the  lower rectum and anus.  Also called piles, hemorrhoids are a common problem.  Hemorrhoids may be internal (inside the rectum) or external (around the anus).  Internal Hemorrhoids  Internal hemorrhoids are often painless, but they rarely cause bleeding.  The internal veins may stretch and fall down (prolapse) through the anus to the outside of the body.  The veins may then become irritated and painful.  External Hemorrhoids  External hemorrhoids can be easily seen or felt around the anal opening.  They are under the skin around the anus.  When the swollen veins are scratched or broken by straining, rubbing or wiping they sometimes bleed.  How Hemorrhoids Occur  Veins in the rectum and around the anus tend to swell under pressure.  Hemorrhoids can result from increased pressure in the veins of your anus or rectum.  Some sources of pressure are:   Straining to have a bowel movement because of constipation  Waiting too long to have a bowel movement  Coughing and sneezing often  Sitting for extended periods of time, including on the toilet  Diarrhea  Obesity  Trauma or injury to the anus  Some liver diseases  Stress  Family history of hemorrhoids  Pregnancy  Pregnant women should try to avoid becoming constipated, because they are more likely to have hemorrhoids during pregnancy.  In the last trimester of pregnancy, the enlarged uterus may press on blood vessels and causes hemorrhoids.  In addition, the strain of childbirth sometimes causes hemorrhoids after the birth.  Symptoms of Hemorrhoids  Some symptoms of hemorrhoids include:  Swelling and/or a tender lump around the anus  Itching, mild burning and bleeding around the anus  Painful bowel movements with or without constipation  Bright red blood covering the stool, on toilet paper or in the toilet bowel.   Symptoms usually go away within a few days.  Always talk to your doctor about any bleeding to make sure it is not  from some other causes.  Diagnosing and Treating Hemorrhoids  Diagnosis is made by an examination by your healthcare provider.  Special test can be performed by your doctor.    Most cases of hemorrhoids can be treated with:  High-fiber diet: Eat more high-fiber foods, which help prevent constipation.  Ask for more detailed fiber information on types and sources of fiber from your healthcare provider.  Fluids: Drink plenty of water.  This helps soften bowel movements so they are easier to pass.  Sitz baths and cold packs: Sitting in lukewarm water two or three times a day for 15 minutes cleases the anal area and may relieve discomfort.  If the water is too hot, swelling around the anus will get worse.  Placing a cloth-covered  ice pack on the anus for ten minutes four times a day can also help reduce selling.  Gently pushing a prolapsed hemorrhoid back inside after the bath or ice pack can be helpful.  Medications: For mild discomfort, your healthcare provider may suggest over-the-counter pain medication or prescribe a cream or ointment for topical use.  The cream may contain witch hazel, zinc oxide or petroleum jelly.  Medicated suppositories are also a treatment option.  Always consult your doctor before applying medications or creams.  Procedures and surgeries: There are also a number of procedures and surgeries to shrink or remove hemorrhoids in more serious cases.  Talk to your physician about these options.  You can often prevent hemorrhoids or keep them from becoming worse by maintaining a healthy lifestyle.  Eat a fiber-rich diet of fruits, vegetables and whole grains.  Also, drink plenty of water and exercise regularly.   2007, [employer] Doc.[place]., DO

## 2016-12-04 NOTE — Patient Instructions (Addendum)
Use the Anusol as prescribed.  Metameucil or Citracel every morning before breakfast in water.  Mirilax for 1-2 weeks at the first sign of hards stools or straining to keep stools soft and easy to pass.  Call or follow up if any persistent symptoms or recurrent issues.  I hope you are feeling better soon!   About Hemorrhoids  Hemorrhoids are swollen veins in the lower rectum and anus.  Also called piles, hemorrhoids are a common problem.  Hemorrhoids may be internal (inside the rectum) or external (around the anus).  Internal Hemorrhoids  Internal hemorrhoids are often painless, but they rarely cause bleeding.  The internal veins may stretch and fall down (prolapse) through the anus to the outside of the body.  The veins may then become irritated and painful.  External Hemorrhoids  External hemorrhoids can be easily seen or felt around the anal opening.  They are under the skin around the anus.  When the swollen veins are scratched or broken by straining, rubbing or wiping they sometimes bleed.  How Hemorrhoids Occur  Veins in the rectum and around the anus tend to swell under pressure.  Hemorrhoids can result from increased pressure in the veins of your anus or rectum.  Some sources of pressure are:   Straining to have a bowel movement because of constipation  Waiting too long to have a bowel movement  Coughing and sneezing often  Sitting for extended periods of time, including on the toilet  Diarrhea  Obesity  Trauma or injury to the anus  Some liver diseases  Stress  Family history of hemorrhoids  Pregnancy  Pregnant women should try to avoid becoming constipated, because they are more likely to have hemorrhoids during pregnancy.  In the last trimester of pregnancy, the enlarged uterus may press on blood vessels and causes hemorrhoids.  In addition, the strain of childbirth sometimes causes hemorrhoids after the birth.  Symptoms of Hemorrhoids  Some symptoms of  hemorrhoids include:  Swelling and/or a tender lump around the anus  Itching, mild burning and bleeding around the anus  Painful bowel movements with or without constipation  Bright red blood covering the stool, on toilet paper or in the toilet bowel.   Symptoms usually go away within a few days.  Always talk to your doctor about any bleeding to make sure it is not from some other causes.  Diagnosing and Treating Hemorrhoids  Diagnosis is made by an examination by your healthcare provider.  Special test can be performed by your doctor.    Most cases of hemorrhoids can be treated with:  High-fiber diet: Eat more high-fiber foods, which help prevent constipation.  Ask for more detailed fiber information on types and sources of fiber from your healthcare provider.  Fluids: Drink plenty of water.  This helps soften bowel movements so they are easier to pass.  Sitz baths and cold packs: Sitting in lukewarm water two or three times a day for 15 minutes cleases the anal area and may relieve discomfort.  If the water is too hot, swelling around the anus will get worse.  Placing a cloth-covered ice pack on the anus for ten minutes four times a day can also help reduce selling.  Gently pushing a prolapsed hemorrhoid back inside after the bath or ice pack can be helpful.  Medications: For mild discomfort, your healthcare provider may suggest over-the-counter pain medication or prescribe a cream or ointment for topical use.  The cream may contain witch hazel, zinc oxide  or petroleum jelly.  Medicated suppositories are also a treatment option.  Always consult your doctor before applying medications or creams.  Procedures and surgeries: There are also a number of procedures and surgeries to shrink or remove hemorrhoids in more serious cases.  Talk to your physician about these options.  You can often prevent hemorrhoids or keep them from becoming worse by maintaining a healthy lifestyle.  Eat a  fiber-rich diet of fruits, vegetables and whole grains.  Also, drink plenty of water and exercise regularly.   2007, [employer] Doc.30

## 2016-12-04 NOTE — Progress Notes (Signed)
Pre visit review using our clinic review tool, if applicable. No additional management support is needed unless otherwise documented below in the visit note. 

## 2017-01-08 ENCOUNTER — Other Ambulatory Visit: Payer: Self-pay | Admitting: Family Medicine

## 2017-01-08 NOTE — Telephone Encounter (Signed)
Pt is due for an annual visit. Sent in 3 months supply for [facility]. Please help patient schedule an appointment.

## 2017-04-06 ENCOUNTER — Other Ambulatory Visit: Payer: Self-pay | Admitting: Family Medicine

## 2017-04-07 NOTE — Telephone Encounter (Signed)
Pt calling to check the status of the Rx Junel Fe  Pharm:  [facility] and state that she will be going out of town and would like to get it today if possible.

## 2017-05-12 DIAGNOSIS — G43839 Menstrual migraine, intractable, without status migrainosus: Secondary | ICD-10-CM | POA: Diagnosis not present

## 2017-05-12 DIAGNOSIS — G43019 Migraine without aura, intractable, without status migrainosus: Secondary | ICD-10-CM | POA: Diagnosis not present

## 2017-05-12 DIAGNOSIS — G43719 Chronic migraine without aura, intractable, without status migrainosus: Secondary | ICD-10-CM | POA: Diagnosis not present

## 2017-06-16 ENCOUNTER — Telehealth: Payer: Self-pay | Admitting: Family Medicine

## 2017-06-16 NOTE — Telephone Encounter (Signed)
MEDICATION: Valtrex  PHARMACY:   [facility]  IS THIS A 90 DAY SUPPLY : no  IS PATIENT OUT OF MEDICATION: no  IF NOT; HOW MUCH IS LEFT: 4 pills left  LAST APPOINTMENT DATE: @07 /05/2016  NEXT APPOINTMENT DATE:@12 /17/2018  OTHER COMMENTS:    **Let patient know to contact pharmacy at the end of the day to make sure medication is ready. **  ** Please notify patient to allow 48-72 hours to process**  **Encourage patient to contact the pharmacy for refills or they can request refills through [facility]**  MEDICATION: Junel  PHARMACY:  [facility]  IS THIS A 90 DAY SUPPLY : yes  IS PATIENT OUT OF MEDICATION: no  IF NOT; HOW MUCH IS LEFT: 2 weeks left  LAST APPOINTMENT DATE: @07 /05/2016  NEXT APPOINTMENT DATE:@12 /17/2018  OTHER COMMENTS:    **Let patient know to contact pharmacy at the end of the day to make sure medication is ready. **  ** Please notify patient to allow 48-72 hours to process**  **Encourage patient to contact the pharmacy for refills or they can request refills through [facility]**

## 2017-06-17 NOTE — Telephone Encounter (Signed)
Called and left a voicemail message for patient to call office and schedule an appointment

## 2017-06-17 NOTE — Telephone Encounter (Signed)
Patient needs an appointment. Dr. Yong Channel has not seen her since 02/10/2016.

## 2017-06-17 NOTE — Telephone Encounter (Signed)
Patient returned phone call and was placed on the schedule

## 2017-06-18 ENCOUNTER — Encounter: Payer: Self-pay | Admitting: Family Medicine

## 2017-06-18 ENCOUNTER — Ambulatory Visit (INDEPENDENT_AMBULATORY_CARE_PROVIDER_SITE_OTHER): Payer: BLUE CROSS/BLUE SHIELD | Admitting: Family Medicine

## 2017-06-18 VITALS — BP 116/64 | HR 78 | Temp 98.4°F | Ht 62.0 in | Wt 120.0 lb

## 2017-06-18 DIAGNOSIS — F419 Anxiety disorder, unspecified: Secondary | ICD-10-CM | POA: Diagnosis not present

## 2017-06-18 DIAGNOSIS — B001 Herpesviral vesicular dermatitis: Secondary | ICD-10-CM | POA: Diagnosis not present

## 2017-06-18 DIAGNOSIS — F321 Major depressive disorder, single episode, moderate: Secondary | ICD-10-CM

## 2017-06-18 MED ORDER — NORETHIN ACE-ETH ESTRAD-FE 1.5-30 MG-MCG PO TABS: 1.0000 | ORAL_TABLET | Freq: Every day | ORAL | 3 refills | Status: DC

## 2017-06-18 MED ORDER — CITALOPRAM HYDROBROMIDE 10 MG PO TABS: 10.0000 mg | ORAL_TABLET | Freq: Every day | ORAL | 5 refills | Status: DC

## 2017-06-18 MED ORDER — VALACYCLOVIR HCL 500 MG PO TABS: 1000.0000 mg | ORAL_TABLET | Freq: Two times a day (BID) | ORAL | 2 refills | Status: DC

## 2017-06-18 NOTE — Progress Notes (Signed)
Subjective:  Michelle Patrick is a 26 y.o. year old very pleasant female patient who presents for/with See problem oriented charting ROS- has some anxiety, depressed mood, anhedonia. No chest pain or shortness of breath.    Past Medical History-  Patient Active Problem List   Diagnosis Date Noted  . Depression, major, single episode, moderate ([place]) 06/18/2017    Priority: High  . Generalized headaches     Priority: Medium  . Anxiety disorder 11/08/2011    Priority: Medium  . 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 Oral contraceptive use 11/08/2011    Priority: Low  . 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 UTI'S, RECURRENT 06/16/2007    Priority: Low   Medications- reviewed and updated Current Outpatient Medications  Medication Sig Dispense Refill  . acyclovir (ZOVIRAX) 5 % ointment Apply topically every 3 (three) hours.      . busPIRone (BUSPAR) 5 MG tablet Take 1 tablet (5 mg total) by mouth 2 (two) times daily as needed. 60 tablet 5  . hydrocortisone (ANUSOL-HC) 2.5 % rectal cream Place 1 application rectally 2 (two) times daily. 30 g 0  . JUNEL FE 1.5/30 1.5-30 MG-MCG tablet TAKE 1 TABLET BY MOUTH DAILY 84 tablet 0  . naratriptan (AMERGE) 2.5 MG tablet     . valACYclovir (VALTREX) 500 MG tablet Take 2 tablets (1,000 mg total) by mouth 2 (two) times daily. For 7 days for cold sores 30 tablet 2   No current facility-administered medications for this visit.     Objective: BP 116/64 (BP Location: Left Arm, Patient Position: Sitting, Cuff Size: Normal)   Pulse 78   Temp 98.4 F (36.9 C) (Oral)   Ht 5\' 2"  (1.575 m)   Wt 120 lb (54.4 kg)   SpO2 98%   BMI 21.95 kg/m  Gen: NAD, resting comfortably CV: RRR no murmurs rubs or gallops Lungs: CTAB no crackles, wheeze, rhonchi Ext: no edema Skin: warm, dry, no rash  Assessment/Plan:  Refilled birth control today. Has regular periods with this.   Depression, major, single episode, moderate  (H[facility]) S: Feels like anxiety has worsened in last few months. Started on buspar 5mg  once a day recently- in past felt nauseous and didn't seem to help. Has not noticed a difference this time either.   Anxiety is weight her down and also noting some anhedonia and depressed mood. HER phq9 is 19 but no SI. In past had been on prozac, paxil paxil didn't help much. Later on effexor since mom has taken that but eventually swithched to prozac for unclear reasons  Stressors- Just bought a house by herself. Boyfriend likely to move in.  A/P: Patient agrees to trial celexa 10mg  with 1 month follow up (already scheduled for CPE). She can use buspar prn as well. This should help with prior anxiety issues as well as new depression.   Anxiety disorder See depression section  Herpes labialis/cold sores S: cold sores have acted up with higher stress recently. Has active lesion on left lip. She is out of valtrex.  A/P: She tends to need longer course for her cold sores- advised her to trial 2g all one day to see if helps at first sign of lesion. She has been given 1g BID for 7 days in past- I did refill this  Future Appointments  Date Time Provider [place]  07/19/2017  2:15 PM Marin Olp, MD [facility] None   Meds ordered this encounter  Medications  . valACYclovir (VALTREX) 500 MG tablet    Sig: Take 2 tablets (1,000 mg total) 2 (two) times daily by mouth. For 7 days for cold sores    Dispense:  30 tablet    Refill:  2  . norethindrone-ethinyl estradiol-iron (JUNEL FE 1.5/30) 1.5-30 MG-MCG tablet    Sig: Take 1 tablet daily by mouth.    Dispense:  90 tablet    Refill:  3  . citalopram (CELEXA) 10 MG tablet    Sig: Take 1 tablet (10 mg total) daily by mouth.    Dispense:  30 tablet    Refill:  5    Return precautions advised.  Garret Reddish, MD

## 2017-06-18 NOTE — Patient Instructions (Signed)
Refilled Junel and valtrex  Start citalopram 10mg . Follow up 1 month  Taking the medicine as directed and not missing any doses is one of the best things you can do to treat your depression and anxiety.  Here are some things to keep in mind:  1) Side effects (stomach upset, some increased anxiety) may happen before you notice a benefit.  These side effects typically go away over time. 2) Changes to your dose of medicine or a change in medication all together is sometimes necessary 3) Most people need to be on medication at least 6-12 months 4) Many people will notice an improvement within two weeks but the full effect of the medication can take up to 4-6 weeks 5) Stopping the medication when you start feeling better often results in a return of symptoms 6) If you start having thoughts of hurting yourself or others after starting this medicine, call our office immediately at [phone] or seek care through 911.

## 2017-06-18 NOTE — Assessment & Plan Note (Signed)
- 

## 2017-06-18 NOTE — Assessment & Plan Note (Signed)
S: Feels like anxiety has worsened in last few months. Started on buspar 5mg  once a day recently- in past felt nauseous and didn't seem to help. Has not noticed a difference this time either.   Anxiety is weight her down and also noting some anhedonia and depressed mood. HER phq9 is 19 but no SI. In past had been on prozac, paxil paxil didn't help much. Later on effexor since mom has taken that but eventually swithched to prozac for unclear reasons  Stressors- Just bought a house by herself. Boyfriend likely to move in.  A/P: Patient agrees to trial celexa 10mg  with 1 month follow up (already scheduled for CPE). She can use buspar prn as well. This should help with prior anxiety issues as well as new depression.

## 2017-06-18 NOTE — Assessment & Plan Note (Signed)
S: cold sores have acted up with higher stress recently. Has active lesion on left lip. She is out of valtrex.  A/P: She tends to need longer course for her cold sores- advised her to trial 2g all one day to see if helps at first sign of lesion. She has been given 1g BID for 7 days in past- I did refill this

## 2017-07-19 ENCOUNTER — Other Ambulatory Visit (HOSPITAL_COMMUNITY)
Admission: RE | Admit: 2017-07-19 | Discharge: 2017-07-19 | Disposition: A | Discharge status: Home / Self Care | Payer: BLUE CROSS/BLUE SHIELD | Source: Ambulatory Visit | Attending: Family Medicine | Admitting: Family Medicine

## 2017-07-19 ENCOUNTER — Ambulatory Visit (INDEPENDENT_AMBULATORY_CARE_PROVIDER_SITE_OTHER): Payer: BLUE CROSS/BLUE SHIELD | Admitting: Family Medicine

## 2017-07-19 ENCOUNTER — Encounter: Payer: Self-pay | Admitting: Family Medicine

## 2017-07-19 VITALS — BP 128/72 | HR 81 | Temp 98.0°F | Ht 63.5 in | Wt 116.2 lb

## 2017-07-19 DIAGNOSIS — Z7251 High risk heterosexual behavior: Secondary | ICD-10-CM

## 2017-07-19 DIAGNOSIS — Z113 Encounter for screening for infections with a predominantly sexual mode of transmission: Secondary | ICD-10-CM | POA: Diagnosis not present

## 2017-07-19 DIAGNOSIS — Z124 Encounter for screening for malignant neoplasm of cervix: Secondary | ICD-10-CM | POA: Insufficient documentation

## 2017-07-19 DIAGNOSIS — Z008 Encounter for other general examination: Secondary | ICD-10-CM | POA: Diagnosis not present

## 2017-07-19 DIAGNOSIS — Z1151 Encounter for screening for human papillomavirus (HPV): Secondary | ICD-10-CM | POA: Diagnosis not present

## 2017-07-19 DIAGNOSIS — F419 Anxiety disorder, unspecified: Secondary | ICD-10-CM

## 2017-07-19 DIAGNOSIS — F321 Major depressive disorder, single episode, moderate: Secondary | ICD-10-CM | POA: Diagnosis not present

## 2017-07-19 DIAGNOSIS — R87610 Atypical squamous cells of undetermined significance on cytologic smear of cervix (ASC-US): Secondary | ICD-10-CM | POA: Insufficient documentation

## 2017-07-19 DIAGNOSIS — Z13220 Encounter for screening for lipoid disorders: Secondary | ICD-10-CM | POA: Diagnosis not present

## 2017-07-19 DIAGNOSIS — N898 Other specified noninflammatory disorders of vagina: Secondary | ICD-10-CM | POA: Diagnosis not present

## 2017-07-19 DIAGNOSIS — Z118 Encounter for screening for other infectious and parasitic diseases: Secondary | ICD-10-CM | POA: Diagnosis not present

## 2017-07-19 DIAGNOSIS — Z0000 Encounter for general adult medical examination without abnormal findings: Secondary | ICD-10-CM | POA: Diagnosis not present

## 2017-07-19 DIAGNOSIS — B373 Candidiasis of vulva and vagina: Secondary | ICD-10-CM | POA: Diagnosis not present

## 2017-07-19 MED ORDER — CITALOPRAM HYDROBROMIDE 20 MG PO TABS: 20.0000 mg | ORAL_TABLET | Freq: Every day | ORAL | 1 refills | Status: DC

## 2017-07-19 MED ORDER — BUSPIRONE HCL 5 MG PO TABS: 5.0000 mg | ORAL_TABLET | Freq: Two times a day (BID) | ORAL | 5 refills | Status: DC | PRN

## 2017-07-19 MED ORDER — CITALOPRAM HYDROBROMIDE 10 MG PO TABS: 10.0000 mg | ORAL_TABLET | Freq: Every day | ORAL | 1 refills | Status: DC

## 2017-07-19 NOTE — Progress Notes (Signed)
Phone: [phone]  Subjective:  Patient presents today for their annual physical. Chief complaint-noted.   See problem oriented charting- ROS- full  review of systems was completed and negative except for: some anxiety, slight vaginal discharge  The following were reviewed and entered/updated in epic: Past Medical History:  Diagnosis Date  . Anxiety   . Dysmenorrhea    on ocps   . Generalized headaches    has seen Dr Gaynell Face  now dr Jalene Mullet  . Recurrent UTI    Patient Active Problem List   Diagnosis Date Noted  . Depression, major, single episode, moderate ([place]) 06/18/2017    Priority: High  . Generalized headaches     Priority: Medium  . Anxiety disorder 11/08/2011    Priority: Medium  . 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 Oral contraceptive use 11/08/2011    Priority: Low  . 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 UTI'S, RECURRENT 06/16/2007    Priority: Low   Past Surgical History:  Procedure Laterality Date  . none      Family History  Problem Relation Age of Onset  . Other Mother        ulcerative proctosigmoiditis  . Diabetes Mother   . Hyperlipidemia Mother   . Hypertension Mother   . Anxiety disorder Mother        GAD  . Diabetes Father   . Hypertension Father   . Hyperlipidemia Father   . Colon polyps Father        adenoma    Medications- reviewed and updated Current Outpatient Medications  Medication Sig Dispense Refill  . busPIRone (BUSPAR) 5 MG tablet Take 1 tablet (5 mg total) by mouth 2 (two) times daily as needed. 60 tablet 5  . citalopram (CELEXA) 20 MG tablet Take 1 tablet (20 mg total) by mouth daily. 90 tablet 1  . naratriptan (AMERGE) 2.5 MG tablet     . norethindrone-ethinyl estradiol-iron (JUNEL FE 1.5/30) 1.5-30 MG-MCG tablet Take 1 tablet daily by mouth. 90 tablet 3  . valACYclovir (VALTREX) 500 MG tablet Take 2 tablets (1,000 mg total) 2 (two) times daily by mouth. For 7 days for cold  sores 30 tablet 2   No current facility-administered medications for this visit.     Allergies-reviewed and updated No Known Allergies  Social History   Socioeconomic History  . Marital status: Single    Spouse name: None  . Number of children: None  . Years of education: None  . Highest education level: None  Social Needs  . Financial resource strain: None  . Food insecurity - worry: None  . Food insecurity - inability: None  . Transportation needs - medical: None  . Transportation needs - non-medical: None  Occupational History  . None  Tobacco Use  . Smoking status: Never Smoker  . Smokeless tobacco: Never Used  Substance and Sexual Activity  . Alcohol use: Yes    Alcohol/week: 0.0 - 0.6 oz  . Drug use: No  . Sexual activity: Yes    Birth control/protection: Pill  Other Topics Concern  . None  Social History Narrative   HH of 3- mom, dad.   Engagement didn't work out- long term boyfriend may move in.        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       Hobbies: enjoys  going to the lake, sleep    Objective: BP 128/72 (BP Location: Left Arm, Patient Position: Sitting, Cuff Size: Normal)   Pulse 81   Temp 98 F (36.7 C) (Oral)   Ht 5' 3.5" (1.613 m)   Wt 116 lb 3.2 oz (52.7 kg)   LMP 07/01/2017   SpO2 98%   BMI 20.26 kg/m  Gen: NAD, resting comfortably HEENT: Mucous membranes are moist. Oropharynx normal Neck: no thyromegaly CV: RRR no murmurs rubs or gallops Lungs: CTAB no crackles, wheeze, rhonchi Abdomen: soft/nontender/nondistended/normal bowel sounds. No rebound or guarding.  Ext: no edema Skin: warm, dry Neuro: grossly normal, moves all extremities, PERRLA  Assessment/Plan:  26 y.o. female presenting for annual physical.  Health Maintenance counseling: 1. Anticipatory guidance: Patient counseled regarding regular dental exams -q6 months, eye exams - does not  see- no issues, wearing seatbelts.  2. Risk factor reduction:  Advised patient of need for regular exercise and diet rich and fruits and vegetables to reduce risk of heart attack and stroke. Discussed would avoid weight loss Wt Readings from Last 3 Encounters:  07/19/17 116 lb 3.2 oz (52.7 kg)  06/18/17 120 lb (54.4 kg)  12/04/16 115 lb 4.8 oz (52.3 kg)  3. Immunizations/screenings/ancillary studies- flu shot declined Immunization History  Administered Date(s) Administered  . Influenza Split 05/16/2012  . Influenza Whole 05/09/2009  . Influenza,inj,Quad PF,6+ Mos 07/30/2014  . Meningococcal Polysaccharide 05/09/2009  . PPD Test 11/13/2011  . Tdap 11/13/2011  4. Cervical cancer screening- 02/20/15 with ascus but HPV negative. Needs repeat with cotesting- will do today 5. Breast cancer screening-  breast exam - does self exams- no issues and mammogram get baseline at age 14 then yearly at 2 6. Colon cancer screening - no family history, start at age 18-50 7. Skin cancer screening- advised regular sunscreen use. Denies worrisome, changing, or new skin lesions.  8. Birth control/STD check- LMP 07/01/17. Compliant with regular birth control. No protection- opts in to std testing  Status of chronic or acute concerns   Migraines- continues on amerge as needed for migraines. Used to be on topamax- may have to restart as using fair amount of excedirin.   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  Slight vaginal discharge- will get candida and BV testing in addition to gonorrhea/chlamydia/trich for STD testing  Preventative health care - Plan: LDL cholesterol, direct, CBC, Comprehensive metabolic panel, HIV antibody, RPR  Unprotected sex - Plan: Cytology - PAP, HIV antibody, RPR  Cervical cancer screening - Plan: Cytology - PAP  Vaginal discharge - Plan: Cytology - PAP  Screening for hyperlipidemia - Plan: LDL cholesterol, direct  Depression, major, single episode, moderate (HCC) - Plan: CBC,  Comprehensive metabolic panel  Anxiety disorder, unspecified type - Plan: CBC, Comprehensive metabolic panel  Depression, major, single episode, moderate (HCC) Depression and anxiety- slightly poorly controlled on celexa 10mg  with GAD7 of 14 but no SI and GAD7 of 12. Also uses buspar for anxiety twice a day- ran out within the month (refilled). Increase dose to 20mg . Same precautions as last time- particularly discussed suicide black box warning  6 week follow up  Orders Placed This Encounter  Procedures  . LDL cholesterol, direct    [place]  . CBC    [place]  . Comprehensive metabolic panel    [place]  . HIV antibody  . RPR    [facility]   Meds ordered this encounter  Medications  . DISCONTD: citalopram (CELEXA) 10 MG tablet    Sig: Take  1 tablet (10 mg total) by mouth daily.    Dispense:  90 tablet    Refill:  1  . citalopram (CELEXA) 20 MG tablet    Sig: Take 1 tablet (20 mg total) by mouth daily.    Dispense:  90 tablet    Refill:  1  . busPIRone (BUSPAR) 5 MG tablet    Sig: Take 1 tablet (5 mg total) by mouth 2 (two) times daily as needed.    Dispense:  60 tablet    Refill:  5    Return precautions advised.  Garret Reddish, MD

## 2017-07-19 NOTE — Assessment & Plan Note (Signed)
Depression and anxiety- slightly poorly controlled on celexa 10mg  with GAD7 of 14 but no SI and GAD7 of 12. Also uses buspar for anxiety twice a day- ran out within the month (refilled). Increase dose to 20mg . Same precautions as last time- particularly discussed suicide black box warning

## 2017-07-19 NOTE — Patient Instructions (Addendum)
Every 6 month dental exams advised  Do you see an eye doctor?  Forgot to mention- good healthy weight though wouldn't lose anymore Wt Readings from Last 3 Encounters:  07/19/17 116 lb 3.2 oz (52.7 kg)  06/18/17 120 lb (54.4 kg)  12/04/16 115 lb 4.8 oz (52.3 kg)   With unprotected sex- screen for STDs Slight vaginal discharge- also check for yeast or bacterial vaginosis  Increase celexa to 20mg  and see [place] back in 6 weeks

## 2017-07-20 LAB — COMPREHENSIVE METABOLIC PANEL
ALT: 13 U/L (ref 0–35)
AST: 15 U/L (ref 0–37)
Albumin: 4 g/dL (ref 3.5–5.2)
Alkaline Phosphatase: 25 U/L — ABNORMAL LOW (ref 39–117)
BILIRUBIN TOTAL: 0.5 mg/dL (ref 0.2–1.2)
BUN: 18 mg/dL (ref 6–23)
CALCIUM: 9.1 mg/dL (ref 8.4–10.5)
CO2: 26 meq/L (ref 19–32)
Chloride: 104 mEq/L (ref 96–112)
Creatinine, Ser: 1.02 mg/dL (ref 0.40–1.20)
GFR: 69.09 mL/min (ref >60.00)
Glucose, Bld: 84 mg/dL (ref 70–99)
POTASSIUM: 4.5 meq/L (ref 3.5–5.1)
Sodium: 137 mEq/L (ref 135–145)
Total Protein: 6.5 g/dL (ref 6.0–8.3)

## 2017-07-20 LAB — CBC
HEMATOCRIT: 45.1 % (ref 36.0–46.0)
HEMOGLOBIN: 14.8 g/dL (ref 12.0–15.0)
MCHC: 32.9 g/dL (ref 30.0–36.0)
MCV: 97.1 fl (ref 78.0–100.0)
PLATELETS: 289 10*3/uL (ref 150.0–400.0)
RBC: 4.64 Mil/uL (ref 3.87–5.11)
RDW: 13.7 % (ref 11.5–15.5)
WBC: 7.9 10*3/uL (ref 4.0–10.5)

## 2017-07-20 LAB — LDL CHOLESTEROL, DIRECT: LDL DIRECT: 88 mg/dL

## 2017-07-20 LAB — RPR: RPR: NONREACTIVE

## 2017-07-20 LAB — HIV ANTIBODY (ROUTINE TESTING W REFLEX): HIV 1&2 Ab, 4th Generation: NONREACTIVE

## 2017-07-21 LAB — CYTOLOGY - PAP
BACTERIAL VAGINITIS: NEGATIVE
CANDIDA VAGINITIS: POSITIVE — AB
Chlamydia: NEGATIVE
Diagnosis: NEGATIVE
HPV: NOT DETECTED
Neisseria Gonorrhea: NEGATIVE
TRICH (WINDOWPATH): NEGATIVE

## 2017-09-01 ENCOUNTER — Other Ambulatory Visit: Payer: Self-pay | Admitting: Family Medicine

## 2017-09-07 ENCOUNTER — Telehealth: Payer: Self-pay | Admitting: Family Medicine

## 2017-09-07 NOTE — Telephone Encounter (Signed)
Copied from [place]. Topic: Bill or Statement - Patient/Guarantor Payment >> Sep 07, 2017  1:49 PM Percell Belt A wrote: Patient name/MRN/Acct #: DOS: 07/19/2017 Additional Information: She rec a bill for blood work that she was insured that it would be covered.  She has talked to billing and ins company and they told her she would have to talk to practice .  She was billed for $564.41 and $10.66   Route to appropriate [employer] pool.

## 2017-09-13 NOTE — Telephone Encounter (Signed)
Left message for patient to call back  

## 2017-09-22 NOTE — Telephone Encounter (Signed)
I called patient and she was not available. I left a message with mother(on DRP) to let her know that we were having [facility] resubmit the charges with different ICD10 codes.Mother stated that she understood and would let the patient know.

## 2017-09-22 NOTE — Telephone Encounter (Signed)
Spoke with patient today- she has not heard anything. Spoke with [name]- she will clarify a few things with Dawn and reach out to the patient.

## 2017-12-17 ENCOUNTER — Encounter: Payer: Self-pay | Admitting: Family Medicine

## 2018-02-07 ENCOUNTER — Other Ambulatory Visit: Payer: Self-pay | Admitting: Family Medicine

## 2018-02-07 NOTE — Telephone Encounter (Signed)
Dr Yong Channel,  Per OV note on 07/19/17: "Depression, major, single episode, moderate (HCC) Depression and anxiety- slightly poorly controlled on celexa 10mg  with GAD7 of 14 but no SI and GAD7 of 12. Also uses buspar for anxiety twice a day- ran out within the month (refilled). Increase dose to 20mg . Same precautions as last time- particularly discussed suicide black box warning"  I called patient and she states that she cannot afford to come in and follow up currently. She states that she is doing good and either the medication is helping more or she is controlling it better.  Please advise on refill.

## 2018-02-07 NOTE — Telephone Encounter (Signed)
Can you follow up with her and see if she can come in at this point to recheck? She got hit with a very costly bill from our office that [name] has been working on- perhaps [name] can provide an update when she gets back. dont want patient to run out though so you can do 30 days until we get info from [place] - if the reason patient wont come in is due to prior expense still

## 2018-02-07 NOTE — Telephone Encounter (Signed)
When I was on the phone with her she did say that she is still dealing with that bill from our office which was part of the reason she cannot afford to come in. I will send in the 30 days and route this to [name] to look into when she gets back.

## 2018-02-18 NOTE — Telephone Encounter (Signed)
LVM for patient to call back. I submitted letters to the hospital billing department and they submitted a charge correction on 12/23/17. Michelle Patrick will need to contact the number on her bill to get an update on the response from insurance. I am  Unable to see the hospital billing response.

## 2018-03-18 ENCOUNTER — Other Ambulatory Visit: Payer: Self-pay | Admitting: Family Medicine

## 2018-04-21 ENCOUNTER — Other Ambulatory Visit: Payer: Self-pay | Admitting: Family Medicine

## 2018-05-21 ENCOUNTER — Other Ambulatory Visit: Payer: Self-pay | Admitting: Family Medicine

## 2018-06-01 DIAGNOSIS — G43839 Menstrual migraine, intractable, without status migrainosus: Secondary | ICD-10-CM | POA: Diagnosis not present

## 2018-06-01 DIAGNOSIS — G43019 Migraine without aura, intractable, without status migrainosus: Secondary | ICD-10-CM | POA: Diagnosis not present

## 2018-06-01 DIAGNOSIS — G43719 Chronic migraine without aura, intractable, without status migrainosus: Secondary | ICD-10-CM | POA: Diagnosis not present

## 2018-06-21 ENCOUNTER — Telehealth: Payer: Self-pay | Admitting: Family Medicine

## 2018-06-21 NOTE — Telephone Encounter (Signed)
This was addressed in February. The charges were resubmitted to [facility] with a different ICD-10 code for billing. There are no other notes in chart. Can you follow up to see where she is in this process?  She has not been seen in a year.

## 2018-06-21 NOTE — Telephone Encounter (Signed)
Copied from [place] [id number]. Topic: General - Other >> Jun 21, 2018  8:58 AM Bea Graff, NT wrote: Reason for CRM: Pt states that she cannot afford to come in at this time for an appointment. She states she is still dealing with a bill from last year due to wrong coding. She is wanting to know if she has to come in for her birth control refill next month, or if Dr. Yong Channel will continue to fill. Please advise.

## 2018-06-21 NOTE — Telephone Encounter (Signed)
Yes [name]-could definitely use your help on this  Jamie-I am okay with you refilling her birth control medicine for at least another month while we try to get this corrected.  She should be able to come in for a physical without charge-correct [name]?

## 2018-06-22 NOTE — Telephone Encounter (Signed)
Yes, she would be due for a CPE after 07/19/17. Her CPE was covered last year by her insurance. The issue was with her Pap smear and we sent a letter to [facility] billing for it to be refiled with a different CPT code. I am unable to see what the outstanding balance on her account is coming from. I have spoken to the patient about this  and advised her to contact hospital billing to find out why her insurance is not paying.

## 2018-06-22 NOTE — Telephone Encounter (Signed)
Thanks [name]-   team you can refill her birth control to get her to physical.  Please schedule her for a physical on 07/19/18 or later- once again you can give her enough birth control pills to get her to that visit.

## 2018-06-23 NOTE — Telephone Encounter (Signed)
Left voicemail requesting call back.  

## 2018-06-24 NOTE — Telephone Encounter (Signed)
See note

## 2018-06-24 NOTE — Telephone Encounter (Signed)
Patient scheduled for physical on 01/30.

## 2018-06-28 NOTE — Telephone Encounter (Signed)
Birth control refilled.

## 2018-07-25 NOTE — Telephone Encounter (Signed)
Patient is calling back  wanted to be sure that a 28month supply is requested. Please advise.

## 2018-07-25 NOTE — Telephone Encounter (Signed)
See note

## 2018-07-25 NOTE — Telephone Encounter (Signed)
Patient calling back regarding this medication stating she was supposed to have enough refills to get her to appointment scheduled for 1/30. Patient is requesting one additional refill be sent to pharmacy if possible. Patient is requesting a call back to discuss further, if needed. Please advise.   Cb# [phone]

## 2018-07-28 ENCOUNTER — Telehealth: Payer: Self-pay | Admitting: Family Medicine

## 2018-07-28 MED ORDER — NORETHIN ACE-ETH ESTRAD-FE 1.5-30 MG-MCG PO TABS: 1.0000 | ORAL_TABLET | Freq: Every day | ORAL | 0 refills | Status: DC

## 2018-07-28 NOTE — Telephone Encounter (Signed)
Copied from [place] [id number]. Topic: Quick Communication - Rx Refill/Question >> Jul 28, 2018  3:47 PM Michelle Patrick wrote: Medication:  JUNEL FE 1.5/30 1.5-30 MG-MCG tablet [[id number]]    Has the patient contacted their pharmacy? Yes.   (Agent: If no, request that the patient contact the pharmacy for the refill.) (Agent: If yes, when and what did the pharmacy advise?) Patient states that she needs a 90 day supply sent, a 30 day was sent but her Insurance will not cover it unless it is a 90 day supply.  Preferred Pharmacy (with phone number or street name): [facility] - [place], [place]. [phone] (Phone) [phone] (Fax)    Agent: Please be advised that RX refills may take up to 3 business days. We ask that you follow-up with your pharmacy.

## 2018-07-29 ENCOUNTER — Other Ambulatory Visit: Payer: Self-pay

## 2018-07-29 ENCOUNTER — Telehealth: Payer: Self-pay | Admitting: Family Medicine

## 2018-07-29 MED ORDER — NORETHIN ACE-ETH ESTRAD-FE 1.5-30 MG-MCG PO TABS: 1.0000 | ORAL_TABLET | Freq: Every day | ORAL | 0 refills | Status: DC

## 2018-07-29 NOTE — Telephone Encounter (Signed)
Pt needs 3 month supply insurance will not cover 1 month supply. Pt appt for 09-01-18 must be rsc. Pt mom called

## 2018-07-29 NOTE — Telephone Encounter (Signed)
Copied from [place] [phone]. Topic: General - Other >> Jul 29, 2018 12:31 PM Judyann Munson wrote: Reason for CRM: Patient is calling to state the medication for her birth control needs 3 month supply insurance will not cover 1 month supply. Pt appt for 09-01-18 must be rsc.  Pt stated she will be out of medication by Sunday. Pt is requesting a call back

## 2018-07-29 NOTE — Telephone Encounter (Signed)
3 month prescription sent. No refills will be given until patient is seen in office

## 2018-07-29 NOTE — Telephone Encounter (Signed)
Refill sent in that should get patient to scheduled appointment

## 2018-07-29 NOTE — Telephone Encounter (Signed)
See note

## 2018-07-29 NOTE — Telephone Encounter (Signed)
Prescription sent to pharmacy for 3 month supply. No refills will be given until patient is seen in office

## 2018-08-25 ENCOUNTER — Other Ambulatory Visit: Payer: Self-pay | Admitting: Family Medicine

## 2018-09-01 ENCOUNTER — Encounter: Payer: BLUE CROSS/BLUE SHIELD | Admitting: Family Medicine

## 2018-10-10 ENCOUNTER — Other Ambulatory Visit: Payer: Self-pay | Admitting: Family Medicine

## 2018-10-10 MED ORDER — NORETHIN ACE-ETH ESTRAD-FE 1.5-30 MG-MCG PO TABS: 1.0000 | ORAL_TABLET | Freq: Every day | ORAL | 0 refills | Status: DC

## 2018-10-10 NOTE — Telephone Encounter (Signed)
Please see request below. Pt requesting a 90 day supply due to insurance.  LOV 07/19/17 Pt has appt previously scheduled for 10/11/18 that was cancelled. Next OV scheduled on 11/15/18.

## 2018-10-10 NOTE — Telephone Encounter (Signed)
See note

## 2018-10-10 NOTE — Telephone Encounter (Signed)
Copied from [place] [id number]. Topic: Quick Communication - Rx Refill/Question >> Oct 10, 2018  8:45 AM Alanda Slim E wrote: Medication: norethindrone-ethinyl estradiol-iron (JUNEL FE 1.5/30) 1.5-30 MG-MCG tablet - insurance covers 90 day supply (Pt has a CPE scheduled for 4.14.20)  Has the patient contacted their pharmacy? Yes   Preferred Pharmacy (with phone number or street name): [facility] - [place], [place]. [phone] (Phone) [phone] (Fax)    Agent: Please be advised that RX refills may take up to 3 business days. We ask that you follow-up with your pharmacy.

## 2018-10-11 ENCOUNTER — Encounter: Payer: BLUE CROSS/BLUE SHIELD | Admitting: Family Medicine

## 2018-10-17 ENCOUNTER — Emergency Department (HOSPITAL_COMMUNITY): Payer: No Typology Code available for payment source

## 2018-10-17 ENCOUNTER — Other Ambulatory Visit: Payer: Self-pay

## 2018-10-17 ENCOUNTER — Encounter (HOSPITAL_COMMUNITY): Payer: Self-pay | Admitting: Emergency Medicine

## 2018-10-17 ENCOUNTER — Emergency Department (HOSPITAL_COMMUNITY)
Admission: EM | Admit: 2018-10-17 | Discharge: 2018-10-17 | Disposition: A | Discharge status: Home / Self Care | Payer: No Typology Code available for payment source | Attending: Emergency Medicine | Admitting: Emergency Medicine

## 2018-10-17 DIAGNOSIS — S61253A Open bite of left middle finger without damage to nail, initial encounter: Secondary | ICD-10-CM | POA: Diagnosis not present

## 2018-10-17 DIAGNOSIS — Y93K9 Activity, other involving animal care: Secondary | ICD-10-CM | POA: Diagnosis not present

## 2018-10-17 DIAGNOSIS — Y990 Civilian activity done for income or pay: Secondary | ICD-10-CM | POA: Insufficient documentation

## 2018-10-17 DIAGNOSIS — Z79899 Other long term (current) drug therapy: Secondary | ICD-10-CM | POA: Diagnosis not present

## 2018-10-17 DIAGNOSIS — Y929 Unspecified place or not applicable: Secondary | ICD-10-CM | POA: Diagnosis not present

## 2018-10-17 DIAGNOSIS — S61255A Open bite of left ring finger without damage to nail, initial encounter: Secondary | ICD-10-CM | POA: Diagnosis not present

## 2018-10-17 DIAGNOSIS — Z23 Encounter for immunization: Secondary | ICD-10-CM | POA: Insufficient documentation

## 2018-10-17 DIAGNOSIS — W540XXA Bitten by dog, initial encounter: Secondary | ICD-10-CM | POA: Insufficient documentation

## 2018-10-17 MED ORDER — AMOXICILLIN-POT CLAVULANATE 875-125 MG PO TABS: 1.0000 | ORAL_TABLET | Freq: Two times a day (BID) | ORAL | 0 refills | Status: DC

## 2018-10-17 MED ORDER — TETANUS-DIPHTH-ACELL PERTUSSIS 5-2.5-18.5 LF-MCG/0.5 IM SUSP
0.5000 mL | Freq: Once | INTRAMUSCULAR | Status: AC
  Administered 2018-10-17: 0.5 mL via INTRAMUSCULAR
  Filled 2018-10-17: qty 0.5

## 2018-10-17 MED ORDER — AMOXICILLIN-POT CLAVULANATE 875-125 MG PO TABS
1.0000 | ORAL_TABLET | Freq: Once | ORAL | Status: AC
  Administered 2018-10-17: 1 via ORAL
  Filled 2018-10-17: qty 1

## 2018-10-17 NOTE — ED Notes (Signed)
Patient verbalizes understanding of discharge instructions . Opportunity for questions and answers were provided . Armband removed by staff ,Pt discharged from ED. W/C  offered at D/C  and Declined W/C at D/C and was escorted to lobby by RN.  

## 2018-10-17 NOTE — ED Triage Notes (Signed)
Pt presetns with c/o animal bite to the tip of her finger on the left hand. She works at an [employer]. Area cleaned at the time of event. Dog does not have any recorded vacc.

## 2018-10-17 NOTE — Discharge Instructions (Addendum)
X-ray of hand was negative.   Please read attached information. If you experience any new or worsening signs or symptoms please return to the emergency room for evaluation. Please follow-up with your primary care provider or specialist as discussed. Please use medication prescribed only as directed and discontinue taking if you have any concerning signs or symptoms.

## 2018-10-17 NOTE — ED Provider Notes (Signed)
[facility] EMERGENCY DEPARTMENT Provider Note   CSN: [id number] Arrival date & time: 10/17/18  1326   History   Chief Complaint Chief Complaint  Patient presents with  . Animal Bite    HPI Michelle Patrick is a 28 y.o. female.    HPI   28 year old female presents today after being bit by a [place] Beau terrier.  She notes she works at [employer].  She was bit by dog he was in for unrelated evaluation.  She notes the bite was on the left third finger along with distal tip.  She notes she is not current on her tetanus.  She notes that the dog does not have vaccinations.  Denies any chronic health conditions.  Notes she cleansed the wound with chlorhexidine    Past Medical History:  Diagnosis Date  . Anxiety   . Dysmenorrhea    on ocps   . Generalized headaches    has seen Dr Gaynell Face  now dr Jalene Mullet  . Recurrent UTI     Patient Active Problem List   Diagnosis Date Noted  . Depression, major, single episode, moderate ([place]) 06/18/2017  . Generalized headaches   . Anxiety disorder 11/08/2011  . Oral contraceptive use 11/08/2011  . 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 [place], BREAST 12/28/2007  . UTI'S, RECURRENT 06/16/2007    Past Surgical History:  Procedure Laterality Date  . none       OB History   No obstetric history on file.      Home Medications    Prior to Admission medications   Medication Sig Start Date End Date Taking? Authorizing Provider  amoxicillin-clavulanate (AUGMENTIN) 875-125 MG tablet Take 1 tablet by mouth every 12 (twelve) hours. 10/17/18   Veena Sturgess, Dellis Filbert, PA-C  busPIRone (BUSPAR) 5 MG tablet TAKE 1 TABLET BY MOUTH TWICE DAILY AS NEEDED 08/25/18   Marin Olp, MD  citalopram (CELEXA) 20 MG tablet TAKE 1 TABLET BY MOUTH DAILY 08/25/18   Marin Olp, MD  naratriptan [facility]) 2.5 MG tablet  11/15/12   [provider]  norethindrone-ethinyl estradiol-iron (JUNEL FE 1.5/30) 1.5-30 MG-MCG  tablet Take 1 tablet by mouth daily. Must keep 09/01/18 appointment for more refills. 10/10/18   Marin Olp, MD  valACYclovir (VALTREX) 500 MG tablet TAKE 2 TABLETS BY MOUTH TWICE DAILY FOR 7 DAYS FOR COLD SORES 05/23/18   Marin Olp, MD    Family History Family History  Problem Relation Age of Onset  . Other Mother        ulcerative proctosigmoiditis  . Diabetes Mother   . Hyperlipidemia Mother   . Hypertension Mother   . Anxiety disorder Mother        GAD  . Diabetes Father   . Hypertension Father   . Hyperlipidemia Father   . Colon polyps Father        adenoma    Social History Social History   Tobacco Use  . Smoking status: Never Smoker  . Smokeless tobacco: Never Used  Substance Use Topics  . Alcohol use: Yes    Alcohol/week: 0.0 - 1.0 standard drinks  . Drug use: No     Allergies   Patient has no known allergies.   Review of Systems Review of Systems  All other systems reviewed and are negative.    Physical Exam Updated Vital Signs BP (!) 142/90 (BP Location: Right Arm)   Pulse 69   Temp 98.9 F (37.2 C) (Oral)  Resp 16   SpO2 98%   Physical Exam Vitals signs and nursing note reviewed.  Constitutional:      Appearance: She is well-developed.  HENT:     Head: Normocephalic and atraumatic.  Eyes:     General: No scleral icterus.       Right eye: No discharge.        Left eye: No discharge.     Conjunctiva/sclera: Conjunctivae normal.     Pupils: Pupils are equal, round, and reactive to light.  Neck:     Musculoskeletal: Normal range of motion.     Vascular: No JVD.     Trachea: No tracheal deviation.  Pulmonary:     Effort: Pulmonary effort is normal.     Breath sounds: No stridor.  Musculoskeletal:     Comments: Puncture wound noted along any distal left third finger pad  Neurological:     Mental Status: She is alert and oriented to person, place, and time.     Coordination: Coordination normal.  Psychiatric:         Behavior: Behavior normal.        Thought Content: Thought content normal.        Judgment: Judgment normal.      ED Treatments / Results  Labs (all labs ordered are listed, but only abnormal results are displayed) Labs Reviewed - No data to display  EKG None  Radiology Dg Finger Middle Left  Result Date: 10/17/2018 CLINICAL DATA:  Dog bite today with pain and swelling, initial encounter EXAM: LEFT MIDDLE FINGER 2+V COMPARISON:  11/29/2015 FINDINGS: There is no evidence of fracture or dislocation. Mild soft tissue changes are noted along the path of the third digit consistent with the recent injury. IMPRESSION: No acute bony abnormality noted. Electronically Signed   By: Inez Catalina M.D.   On: 10/17/2018 15:56    Procedures Procedures (including critical care time)  Medications Ordered in ED Medications  amoxicillin-clavulanate (AUGMENTIN) 875-125 MG per tablet 1 tablet (1 tablet Oral Given 10/17/18 1447)  Tdap (BOOSTRIX) injection 0.5 mL (0.5 mLs Intramuscular Given 10/17/18 1537)     Initial Impression / Assessment and Plan / ED Course  I have reviewed the triage vital signs and the nursing notes.  Pertinent labs & imaging results that were available during my care of the patient were reviewed by me and considered in my medical decision making (see chart for details).         Imagining: Dg finger  Meds- TDAP- Augmentin   28 year old female presents today with bite to her distal finger.  I discussed management including rabies prophylaxis here versus quarantine of the animal.  Patient will like to have the animal quarantined at this time.  Animal control consulted.  Patient will be discharged with Augmentin, directions to follow-up with animal control to assure proper procedure, and strict return precautions.  She verbalized understanding and agreement to today's plan had no further questions or concerns.  Final Clinical Impressions(s) / ED Diagnoses   Final diagnoses:   Dog bite, initial encounter    ED Discharge Orders         Ordered    amoxicillin-clavulanate (AUGMENTIN) 875-125 MG tablet  Every 12 hours     10/17/18 1513           Okey Regal, PA-C 10/18/18 1244    Drenda Freeze, MD 10/18/18 1758

## 2018-11-15 ENCOUNTER — Encounter: Payer: BLUE CROSS/BLUE SHIELD | Admitting: Family Medicine

## 2019-01-10 ENCOUNTER — Other Ambulatory Visit: Payer: Self-pay | Admitting: Family Medicine

## 2019-02-07 ENCOUNTER — Telehealth: Payer: Self-pay | Admitting: Family Medicine

## 2019-02-07 MED ORDER — NORETHIN ACE-ETH ESTRAD-FE 1.5-30 MG-MCG PO TABS: 1.0000 | ORAL_TABLET | Freq: Every day | ORAL | 1 refills | Status: DC

## 2019-02-07 NOTE — Telephone Encounter (Signed)
Per note, pt had to cancel OV d/t death in the family. Visit r/s to 03/15/2019. Last OV 07/2017.   OK to refill?

## 2019-02-07 NOTE — Telephone Encounter (Signed)
Pt called and stated that she needs to cancel appointment but would like BLISOVI FE 1.5/30 1.5-30 MG-MCG tablet [[id number]]    Pt states that she had a death in the family.  [facility] - [place], [place]. [phone] (Phone) [phone] (Fax)    Please advise

## 2019-02-07 NOTE — Telephone Encounter (Signed)
I provided enough refills to get her to her visit

## 2019-02-08 NOTE — Telephone Encounter (Signed)
Pt returned call and was advised that rx was refilled and she will need to keep her August appt for additional refills as it has been over a year since she was last seen.

## 2019-02-08 NOTE — Telephone Encounter (Signed)
Called and left message to have pt call the office.

## 2019-02-09 ENCOUNTER — Encounter: Payer: BLUE CROSS/BLUE SHIELD | Admitting: Family Medicine

## 2019-03-08 ENCOUNTER — Telehealth: Payer: Self-pay | Admitting: Family Medicine

## 2019-03-08 NOTE — Telephone Encounter (Signed)
Left voice message for patient to call clinic.  

## 2019-03-08 NOTE — Telephone Encounter (Signed)
She should have enough medication to get her through September based on last refills- she needs to keep her August 19 appointment.  Please decline this 26-month supply refill request but she can keep the 60-day supply already given

## 2019-03-08 NOTE — Telephone Encounter (Signed)
Patient called in she said she will be out of her [facility] this Sunday and she needs it refilled and she needs it for 3 months or her insurance will not cover it.   BLISOVI FE 1.5/30 1.5-30 MG-MCG tablet [[id number]]    Pt states that she had a death in the family.  [facility] - [place], [place]. [phone] (Phone) [phone] (Fax)

## 2019-03-08 NOTE — Telephone Encounter (Signed)
Pt has not been seen since 07/2017. She has canceled visit on 11/15/18, 02/09/19, and 03/15/19. She was advised last month that she would need to keep schedule appt 03/22/19 before any additional refills could be provided.   Dr/ Yong Channel, are you OK with declining refill request until pt is seen?

## 2019-03-09 ENCOUNTER — Other Ambulatory Visit: Payer: Self-pay | Admitting: Family Medicine

## 2019-03-09 MED ORDER — NORETHIN ACE-ETH ESTRAD-FE 1.5-30 MG-MCG PO TABS: 1.0000 | ORAL_TABLET | Freq: Every day | ORAL | 0 refills | Status: DC

## 2019-03-09 NOTE — Telephone Encounter (Signed)
Pt called and stated that she would like to know if the 57mo can be refilled. She sates that she did not want to come in because of the virus and she had a death in the family. Pt states that he insurance will not cover medication unless a 109mo supply is called in. Please call pt back.    PP#[phone]

## 2019-03-09 NOTE — Telephone Encounter (Signed)
I was notified verbally by Theadora Rama that this has been taking care of. Rx was called in but pt needs to keep appt. No further action needed!

## 2019-03-09 NOTE — Telephone Encounter (Signed)
Please see below.

## 2019-03-09 NOTE — Progress Notes (Signed)
Must keep upcoming appointment- no more refills

## 2019-03-15 ENCOUNTER — Encounter: Payer: BC Managed Care – PPO | Admitting: Family Medicine

## 2019-03-22 ENCOUNTER — Encounter: Payer: Self-pay | Admitting: Family Medicine

## 2019-03-22 ENCOUNTER — Ambulatory Visit (INDEPENDENT_AMBULATORY_CARE_PROVIDER_SITE_OTHER): Payer: BC Managed Care – PPO | Admitting: Family Medicine

## 2019-03-22 ENCOUNTER — Other Ambulatory Visit: Payer: Self-pay

## 2019-03-22 VITALS — BP 110/80 | HR 80 | Temp 97.5°F | Ht 62.0 in | Wt 114.0 lb

## 2019-03-22 DIAGNOSIS — F321 Major depressive disorder, single episode, moderate: Secondary | ICD-10-CM | POA: Diagnosis not present

## 2019-03-22 DIAGNOSIS — Z0000 Encounter for general adult medical examination without abnormal findings: Secondary | ICD-10-CM | POA: Diagnosis not present

## 2019-03-22 DIAGNOSIS — R519 Headache, unspecified: Secondary | ICD-10-CM

## 2019-03-22 DIAGNOSIS — R51 Headache: Secondary | ICD-10-CM

## 2019-03-22 MED ORDER — CITALOPRAM HYDROBROMIDE 40 MG PO TABS: 40.0000 mg | ORAL_TABLET | Freq: Every day | ORAL | 1 refills | Status: DC

## 2019-03-22 MED ORDER — VALACYCLOVIR HCL 1 G PO TABS: ORAL_TABLET | ORAL | 2 refills | Status: DC

## 2019-03-22 MED ORDER — NORETHIN ACE-ETH ESTRAD-FE 1.5-30 MG-MCG PO TABS: 1.0000 | ORAL_TABLET | Freq: Every day | ORAL | 3 refills | Status: DC
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
## 2019-03-22 NOTE — Patient Instructions (Addendum)
Health Maintenance Due  Topic Date Due  . INFLUENZA VACCINE - declines 03/04/2019   Please call [phone] to schedule a visit with [facility] -Trey Paula is an excellent counselor who is based out of our clinic - I would at least call to see what pricing looks like - Arsenio Katz is an excellent counselor as well who has his own practice ([email or web address])  Increase citalopram to 40mg - schedule 6 weeks follow up.

## 2019-04-05 ENCOUNTER — Other Ambulatory Visit: Payer: Self-pay | Admitting: Family Medicine

## 2019-05-10 DIAGNOSIS — G43839 Menstrual migraine, intractable, without status migrainosus: Secondary | ICD-10-CM | POA: Diagnosis not present

## 2019-05-10 DIAGNOSIS — G43019 Migraine without aura, intractable, without status migrainosus: Secondary | ICD-10-CM | POA: Diagnosis not present

## 2019-05-10 DIAGNOSIS — G43719 Chronic migraine without aura, intractable, without status migrainosus: Secondary | ICD-10-CM | POA: Diagnosis not present

## 2019-11-02 ENCOUNTER — Other Ambulatory Visit: Payer: Self-pay | Admitting: Family Medicine

## 2020-01-17 ENCOUNTER — Encounter (HOSPITAL_COMMUNITY): Payer: Self-pay

## 2020-01-17 ENCOUNTER — Emergency Department (HOSPITAL_COMMUNITY): Payer: BC Managed Care – PPO

## 2020-01-17 ENCOUNTER — Other Ambulatory Visit: Payer: Self-pay

## 2020-01-17 ENCOUNTER — Emergency Department (HOSPITAL_COMMUNITY)
Admission: EM | Admit: 2020-01-17 | Discharge: 2020-01-18 | Disposition: A | Discharge status: Home / Self Care | Payer: BC Managed Care – PPO | Attending: Emergency Medicine | Admitting: Emergency Medicine

## 2020-01-17 DIAGNOSIS — Y9389 Activity, other specified: Secondary | ICD-10-CM | POA: Diagnosis not present

## 2020-01-17 DIAGNOSIS — S066X9A Traumatic subarachnoid hemorrhage with loss of consciousness of unspecified duration, initial encounter: Secondary | ICD-10-CM | POA: Diagnosis not present

## 2020-01-17 DIAGNOSIS — S065XAA Traumatic subdural hemorrhage with loss of consciousness status unknown, initial encounter: Secondary | ICD-10-CM

## 2020-01-17 DIAGNOSIS — Y92410 Unspecified street and highway as the place of occurrence of the external cause: Secondary | ICD-10-CM | POA: Insufficient documentation

## 2020-01-17 DIAGNOSIS — S065X9A Traumatic subdural hemorrhage with loss of consciousness of unspecified duration, initial encounter: Secondary | ICD-10-CM

## 2020-01-17 DIAGNOSIS — I609 Nontraumatic subarachnoid hemorrhage, unspecified: Secondary | ICD-10-CM

## 2020-01-17 DIAGNOSIS — Y999 Unspecified external cause status: Secondary | ICD-10-CM | POA: Insufficient documentation

## 2020-01-17 DIAGNOSIS — S069X9A Unspecified intracranial injury with loss of consciousness of unspecified duration, initial encounter: Secondary | ICD-10-CM | POA: Diagnosis present

## 2020-01-17 DIAGNOSIS — S065X0A Traumatic subdural hemorrhage without loss of consciousness, initial encounter: Secondary | ICD-10-CM | POA: Diagnosis not present

## 2020-01-17 LAB — CBC
HCT: 43.6 % (ref 36.0–46.0)
Hemoglobin: 14.7 g/dL (ref 12.0–15.0)
MCH: 32.2 pg (ref 26.0–34.0)
MCHC: 33.7 g/dL (ref 30.0–36.0)
MCV: 95.6 fL (ref 80.0–100.0)
Platelets: 245 10*3/uL (ref 150–400)
RBC: 4.56 MIL/uL (ref 3.87–5.11)
RDW: 12.7 % (ref 11.5–15.5)
WBC: 13.1 10*3/uL — ABNORMAL HIGH (ref 4.0–10.5)
nRBC: 0 % (ref 0.0–0.2)

## 2020-01-17 LAB — COMPREHENSIVE METABOLIC PANEL
ALT: 32 U/L (ref 0–44)
AST: 37 U/L (ref 15–41)
Albumin: 4.1 g/dL (ref 3.5–5.0)
Alkaline Phosphatase: 33 U/L — ABNORMAL LOW (ref 38–126)
Anion gap: 10 (ref 5–15)
BUN: 18 mg/dL (ref 6–20)
CO2: 22 mmol/L (ref 22–32)
Calcium: 8.9 mg/dL (ref 8.9–10.3)
Chloride: 105 mmol/L (ref 98–111)
Creatinine, Ser: 0.98 mg/dL (ref 0.44–1.00)
GFR calc Af Amer: >60 mL/min (ref >60)
GFR calc non Af Amer: >60 mL/min (ref >60)
Glucose, Bld: 106 mg/dL — ABNORMAL HIGH (ref 70–99)
Potassium: 3.7 mmol/L (ref 3.5–5.1)
Sodium: 137 mmol/L (ref 135–145)
Total Bilirubin: 0.4 mg/dL (ref 0.3–1.2)
Total Protein: 7.2 g/dL (ref 6.5–8.1)

## 2020-01-17 LAB — I-STAT BETA HCG BLOOD, ED (MC, WL, AP ONLY): I-stat hCG, quantitative: <5 m[IU]/mL (ref <5)

## 2020-01-17 LAB — LIPASE, BLOOD: Lipase: 31 U/L (ref 11–51)

## 2020-01-17 MED ORDER — SODIUM CHLORIDE 0.9% FLUSH: 3.0000 mL | Freq: Once | INTRAVENOUS | Status: DC

## 2020-01-17 MED ORDER — ONDANSETRON 4 MG PO TBDP
4.0000 mg | ORAL_TABLET | Freq: Once | ORAL | Status: AC
  Administered 2020-01-17: 4 mg via ORAL
  Filled 2020-01-17: qty 1
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
## 2020-01-18 ENCOUNTER — Encounter (HOSPITAL_COMMUNITY): Payer: Self-pay

## 2020-01-18 ENCOUNTER — Emergency Department (HOSPITAL_COMMUNITY): Payer: BC Managed Care – PPO

## 2020-01-18 MED ORDER — DIPHENHYDRAMINE HCL 50 MG/ML IJ SOLN
25.0000 mg | Freq: Once | INTRAMUSCULAR | Status: AC
  Administered 2020-01-18: 25 mg via INTRAVENOUS
  Filled 2020-01-18: qty 1

## 2020-01-18 MED ORDER — SODIUM CHLORIDE 0.9 % IV BOLUS
1000.0000 mL | Freq: Once | INTRAVENOUS | Status: AC
  Administered 2020-01-18: 1000 mL via INTRAVENOUS

## 2020-01-18 MED ORDER — METOCLOPRAMIDE HCL 5 MG/ML IJ SOLN
10.0000 mg | Freq: Once | INTRAMUSCULAR | Status: AC
  Administered 2020-01-18: 10 mg via INTRAVENOUS
  Filled 2020-01-18: qty 2

## 2020-01-18 MED ORDER — ONDANSETRON 4 MG PO TBDP
4.0000 mg | ORAL_TABLET | Freq: Three times a day (TID) | ORAL | 0 refills | Status: DC | PRN
Start: 2020-01-18 — End: 2020-08-22

## 2020-01-18 MED ORDER — OXYCODONE-ACETAMINOPHEN 5-325 MG PO TABS: 1.0000 | ORAL_TABLET | ORAL | 0 refills | Status: DC | PRN

## 2020-01-18 NOTE — Discharge Instructions (Signed)
As we discussed, your CT scan today did show some areas of mild bleeding and contusion in the brain.  This is likely what is causing your headache and vomiting.  Repeat scan did not show any acute change, injuries are stable. You likely will continue to have some headaches and intermittent nausea over the next 1 to 2 weeks which is typical following injury like this.  Take the prescribed medication as directed to help control symptoms.   I do recommend you rest.  I would avoid driving until headaches are resolving and you feel completely back to normal.   I would try to limit television, computer, cell phone/iPad, reading, etc. for now as this can worsen headaches from eyestrain. Return to the ED for new or worsening symptoms.

## 2020-01-18 NOTE — ED Notes (Signed)
Pt ambulated with a steady gait to and from bathroom. Pt refused to give urine sample.

## 2020-01-18 NOTE — ED Notes (Signed)
Pt refused/declined the UA Test

## 2020-04-10 ENCOUNTER — Other Ambulatory Visit: Payer: Self-pay | Admitting: Family Medicine

## 2020-07-16 ENCOUNTER — Other Ambulatory Visit: Payer: Self-pay | Admitting: Family Medicine

## 2020-07-17 ENCOUNTER — Other Ambulatory Visit: Payer: Self-pay | Admitting: Family Medicine

## 2020-07-19 ENCOUNTER — Other Ambulatory Visit: Payer: Self-pay

## 2020-07-19 ENCOUNTER — Telehealth: Payer: Self-pay

## 2020-07-19 LAB — HM PAP SMEAR: HM Pap smear: NORMAL

## 2020-07-19 MED ORDER — CITALOPRAM HYDROBROMIDE 40 MG PO TABS: 40.0000 mg | ORAL_TABLET | Freq: Every day | ORAL | 0 refills | Status: DC

## 2020-07-19 MED ORDER — BUSPIRONE HCL 5 MG PO TABS: 5.0000 mg | ORAL_TABLET | Freq: Two times a day (BID) | ORAL | 0 refills | Status: DC | PRN

## 2020-07-19 NOTE — Telephone Encounter (Signed)
May give 30 day refills and set her up for next available visit. For celexa have her start with half for a week before going to whole tablet if shes been off. Also please verify no thoughts of self harm/SI

## 2020-07-19 NOTE — Telephone Encounter (Signed)
Spoke with pt, she is aware of Dr.Hunters comments. She isn't having any thoughts of Self Harm or SI. She gave a verbal understanding to the instructions for her Celexa. The medications have been refilled and sent to the pharmacy, her appointment has been scheduled

## 2020-07-19 NOTE — Telephone Encounter (Signed)
..   LAST APPOINTMENT DATE: 07/17/2020   NEXT APPOINTMENT DATE:@Visit  date not found  MEDICATION:busPIRone (BUSPAR) 5 MG tablet  citalopram (CELEXA) 40 MG tablet    PHARMACY: [facility] - [place] - [address] [address]    Pt is totally out and having " weird " feelings since being out of this medication

## 2020-07-19 NOTE — Telephone Encounter (Signed)
Attempted to call patient, No answer [facility]

## 2020-07-19 NOTE — Telephone Encounter (Signed)
Patient was last seen in office Aug 2020 for her physical. No follow up appt. Ok to refill her medications or would you like to see the patient in office? Please advise.

## 2020-08-12 ENCOUNTER — Other Ambulatory Visit: Payer: Self-pay | Admitting: Family Medicine

## 2020-08-12 ENCOUNTER — Telehealth: Payer: Self-pay

## 2020-08-12 MED ORDER — VALACYCLOVIR HCL 1 G PO TABS: ORAL_TABLET | ORAL | 2 refills | Status: DC

## 2020-08-12 MED ORDER — VALACYCLOVIR HCL 500 MG PO TABS: ORAL_TABLET | ORAL | 2 refills | Status: DC

## 2020-08-12 NOTE — Telephone Encounter (Signed)
Rx sent in

## 2020-08-12 NOTE — Telephone Encounter (Signed)
MEDICATION: Valtrex 500 MG  & 1000 MG  PHARMACY: [facility]  Comments:   **Let patient know to contact pharmacy at the end of the day to make sure medication is ready. **  ** Please notify patient to allow 48-72 hours to process**  **Encourage patient to contact the pharmacy for refills or they can request refills through [facility]**

## 2020-08-21 NOTE — Patient Instructions (Addendum)
Health Maintenance Due  Topic Date Due  . Hepatitis C Screening opted out Never done  . PAP SMEAR - we opted to do 5 years as HPV negative 07/19/2020   Note to husband- we really want you around for a long time- pretty please quit smoking! If you do not quit smoking, pretty please smoke outside the home or outside a car [place] is in as second hand smoke can be very hazardous as well.   [email or web address]  Please call [phone] to schedule a visit with [facility] -Trey Paula is an excellent counselor who is based out of our clinic -if symptoms do not continue to improve or worsen please let us know  #Blood pressure slightly high in office today-there may be some lingering anxiety after patient got large bill from the last visit here- we discussed doing some home monitoring-I would like for home blood pressures to be less than 135/85 on average-would be great if she could send me a message within 2 weeks with how her blood pressure is doing - check at rest perhaps 2-3 x if all #s normal on separate days  Recommended follow up: Return in about 1 year (around 08/22/2021) for physical or sooner if needed.
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
## 2020-08-22 ENCOUNTER — Encounter: Payer: Self-pay | Admitting: Family Medicine

## 2020-08-22 ENCOUNTER — Other Ambulatory Visit: Payer: Self-pay

## 2020-08-22 ENCOUNTER — Ambulatory Visit (INDEPENDENT_AMBULATORY_CARE_PROVIDER_SITE_OTHER): Payer: BC Managed Care – PPO | Admitting: Family Medicine

## 2020-08-22 VITALS — BP 144/86 | HR 75 | Temp 98.3°F | Ht 62.0 in | Wt 120.0 lb

## 2020-08-22 DIAGNOSIS — F419 Anxiety disorder, unspecified: Secondary | ICD-10-CM

## 2020-08-22 DIAGNOSIS — Z0000 Encounter for general adult medical examination without abnormal findings: Secondary | ICD-10-CM

## 2020-08-22 DIAGNOSIS — F321 Major depressive disorder, single episode, moderate: Secondary | ICD-10-CM

## 2020-08-22 MED ORDER — CITALOPRAM HYDROBROMIDE 40 MG PO TABS: 40.0000 mg | ORAL_TABLET | Freq: Every day | ORAL | 3 refills | Status: DC

## 2020-08-22 MED ORDER — BUSPIRONE HCL 5 MG PO TABS: 5.0000 mg | ORAL_TABLET | Freq: Two times a day (BID) | ORAL | 3 refills | Status: DC | PRN

## 2020-08-22 MED ORDER — NORETHIN ACE-ETH ESTRAD-FE 1.5-30 MG-MCG PO TABS: 1.0000 | ORAL_TABLET | Freq: Every day | ORAL | 3 refills | Status: DC

## 2020-08-22 MED ORDER — ACYCLOVIR 5 % EX OINT: TOPICAL_OINTMENT | CUTANEOUS | 3 refills | Status: DC

## 2020-09-06 ENCOUNTER — Ambulatory Visit: Payer: Self-pay | Admitting: Family Medicine

## 2021-03-11 ENCOUNTER — Telehealth: Payer: Self-pay

## 2021-03-11 NOTE — Telephone Encounter (Signed)
Pt called about her anxiety medication. She stated that it is not working and she wants to know if there is something else Dr Yong Channel can do. Can a nurse call her back? Please Advise.

## 2021-03-11 NOTE — Telephone Encounter (Signed)
We need to sit down or at least do virtual visit to discuss options- I agree with [facility]

## 2021-03-12 NOTE — Telephone Encounter (Signed)
Please schedule OV or virtual for pt per Dr. Yong Channel.

## 2021-03-12 NOTE — Telephone Encounter (Signed)
Pt is scheduled tomorrow for a virtual visit

## 2021-03-13 ENCOUNTER — Telehealth (INDEPENDENT_AMBULATORY_CARE_PROVIDER_SITE_OTHER): Payer: BC Managed Care – PPO | Admitting: Family Medicine

## 2021-03-13 ENCOUNTER — Encounter: Payer: Self-pay | Admitting: Family Medicine

## 2021-03-13 DIAGNOSIS — F411 Generalized anxiety disorder: Secondary | ICD-10-CM

## 2021-03-13 DIAGNOSIS — F321 Major depressive disorder, single episode, moderate: Secondary | ICD-10-CM | POA: Diagnosis not present

## 2021-03-13 MED ORDER — BUSPIRONE HCL 7.5 MG PO TABS: 7.5000 mg | ORAL_TABLET | Freq: Two times a day (BID) | ORAL | 3 refills | Status: DC | PRN

## 2021-03-13 MED ORDER — DESVENLAFAXINE SUCCINATE ER 50 MG PO TB24: 50.0000 mg | ORAL_TABLET | Freq: Every day | ORAL | 3 refills | Status: DC

## 2021-03-13 MED ORDER — BUSPIRONE HCL 5 MG PO TABS: 5.0000 mg | ORAL_TABLET | Freq: Two times a day (BID) | ORAL | 3 refills | Status: DC | PRN

## 2021-03-13 NOTE — Progress Notes (Signed)
Phone [phone] Virtual visit via Video note   Subjective:  Chief complaint: Chief Complaint  Patient presents with   Depression    Patient wants to change her current medication because she doesn't think it's helping anymore.    Anxiety    This visit type was conducted due to national recommendations for restrictions regarding the COVID-19 Pandemic (e.g. social distancing).  This format is felt to be most appropriate for this patient at this time balancing risks to patient and risks to population by having him in for in person visit.  No physical exam was performed (except for noted visual exam or audio findings with Telehealth visits).    Our team/I connected with Michelle Patrick at  2:20 PM EDT by a video enabled telemedicine application ([email or web address] or caregility through epic) and verified that I am speaking with the correct person using two identifiers.  Location patient: [facility] Location provider: [facility], office Persons participating in the virtual visit:  patient  Our team/I discussed the limitations of evaluation and management by telemedicine and the availability of in person appointments. In light of current covid-19 pandemic, patient also understands that we are trying to protect them by minimizing in office contact if at all possible.  The patient expressed consent for telemedicine visit and agreed to proceed. Patient understands insurance will be billed.   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     busPIRone (BUSPAR) 5 MG tablet Take 1 tablet (5 mg total) by mouth 2 (two) times daily as needed. 180 tablet 3   citalopram (CELEXA) 40 MG tablet Take 1 tablet (40 mg total) by mouth daily. 90 tablet 3   naratriptan (AMERGE) 2.5 MG tablet Take 2.5 mg by mouth as needed for migraine.      norethindrone-ethinyl estradiol-iron (BLISOVI FE 1.5/30) 1.5-30 MG-MCG tablet Take 1 tablet by mouth daily. 84 tablet 3   valACYclovir (VALTREX) 1000 MG tablet TAKE 2 TABLETS BY MOUTH TWICE DAILY FOR 1 DAY AT FIRST SIGN OF COLD SORE 60 tablet 2   No current facility-administered medications for this visit.     Objective:  LMP  (Within Weeks)  self reported vitals Gen: NAD, resting comfortably Lungs: nonlabored, normal respiratory rate  Skin: appears dry, no obvious rash     Assessment and Plan   # Depression/GAD S: Medication:Celexa 40 mg daily, Buspar 5 mg twice daily as needed for GAD -has not tried counseling- some friends that saw therapists she talked to gain some tips -life has been stressful and feels things have worsened - considering pregnancy in next year or two Depression screen [facility] 2/9 03/13/2021 08/22/2020 03/22/2019  Decreased Interest '3 1 2  '$ Down, Depressed, Hopeless '3 2 2  '$ PHQ - 2 Score '6 3 4  '$ Altered sleeping '3 1 3  '$ Tired, decreased  energy '2 1 3  '$ Change in appetite - 0 2  Feeling bad or failure about yourself  0 1 2  Trouble concentrating '2 3 2  '$ Moving slowly or fidgety/restless 0 0 1  Suicidal thoughts 0 0 0  PHQ-9 Score '13 9 17  '$ Difficult doing work/chores Very difficult Somewhat difficult Somewhat difficult   GAD 7 : Generalized Anxiety Score 03/13/2021 08/22/2020 03/22/2019 07/19/2017  Nervous, Anxious, on Edge '3 2 2 2  '$ Control/stop worrying '3 3 3 2  '$ Worry too much - different  things '3 3 3 2  '$ Trouble relaxing '3 3 3 2  '$ Restless '3 3 2 1  '$ Easily annoyed or irritable 1 0 3 2  Afraid - awful might happen '2 1 2 1  '$ Total GAD 7 Score '18 15 18 12  '$ Anxiety Difficulty Very difficult Somewhat difficult Somewhat difficult Somewhat difficult  A/P: Unfortunately instead of improving further after last visit patient has actually worsened-poor control of GAD and depression. -Patient does not feel like citalopram is helpful at this point-we will stop citalopram and start Pristiq 50 mg -Increase buspirone to 7.5 mg twice daily -Follow-up in 6 weeks by MyChart-likely have team update PHQ-9 and GAD-7 with consideration of increasing Pristiq to 100 mg  Alternate or adjunct options discussed today: 1.  Seeing a therapist-patient declines 2.  Seeing psychiatry-patient would like to hold off for now 3.  Changing to Lexapro   Recommended follow up: 6 weeks via MyChart Future Appointments  Date Time Provider [place]  08/26/2021  8:40 AM Marin Olp, MD [facility]    Lab/Order associations:   ICD-10-CM   1. Depression, major, single episode, moderate (HCC)  F32.1     2. Herpes labialis/cold sores  B00.1     3. Generalized anxiety disorder  F41.1       Meds ordered this encounter  Medications   desvenlafaxine (PRISTIQ) 50 MG 24 hr tablet    Sig: Take 1 tablet (50 mg total) by mouth daily.    Dispense:  30 tablet    Refill:  3   DISCONTD: busPIRone (BUSPAR) 5 MG tablet    Sig: Take 1 tablet (5 mg total) by mouth 2 (two) times daily as needed.    Dispense:  180 tablet    Refill:  3   busPIRone (BUSPAR) 7.5 MG tablet    Sig: Take 1 tablet (7.5 mg total) by mouth 2 (two) times daily as needed.    Dispense:  180 tablet    Refill:  3    Return precautions advised.  Garret Reddish, MD
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
## 2021-05-15 ENCOUNTER — Telehealth: Payer: Self-pay

## 2021-05-15 NOTE — Telephone Encounter (Signed)
Hey, can you teams me the key for this please?

## 2021-06-11 ENCOUNTER — Telehealth: Payer: Self-pay

## 2021-06-11 MED ORDER — DESVENLAFAXINE SUCCINATE ER 50 MG PO TB24: 50.0000 mg | ORAL_TABLET | Freq: Every day | ORAL | 0 refills | Status: DC

## 2021-06-11 NOTE — Telephone Encounter (Signed)
Refill for #90 sent to below pharmacy.

## 2021-06-11 NOTE — Telephone Encounter (Signed)
    Patient would like it to be 90 days per her insurance to cover.      MEDICATION: desvenlafaxine (PRISTIQ) 50 MG 24 hr tablet  PHARMACY:  [facility] - [place], [place] - [address] [place] Phone:  [phone]  Fax:  [phone]      Comments: NEEDS 90 DAYS   **Let patient know to contact pharmacy at the end of the day to make sure medication is ready. **  ** Please notify patient to allow 48-72 hours to process**  **Encourage patient to contact the pharmacy for refills or they can request refills through [facility]**

## 2021-08-26 ENCOUNTER — Encounter: Payer: BC Managed Care – PPO | Admitting: Family Medicine

## 2021-09-10 ENCOUNTER — Other Ambulatory Visit: Payer: Self-pay | Admitting: Family Medicine

## 2021-09-10 NOTE — Telephone Encounter (Signed)
The original prescription was discontinued on 03/13/2021 by Marin Olp, MD. Renewing this prescription may not be appropriate

## 2021-09-10 NOTE — Telephone Encounter (Signed)
Please see last note-I thought we had started Pristiq and stopped this medication-please review chart to see if there were any subsequent changes on MyChart or phone calls-if I agree for this to be refilled instead of Pristiq and this can be refilled as long as she is not currently taking Pristiq as well

## 2021-09-17 ENCOUNTER — Other Ambulatory Visit: Payer: Self-pay | Admitting: Family Medicine

## 2021-10-07 ENCOUNTER — Other Ambulatory Visit: Payer: Self-pay | Admitting: Family Medicine

## 2021-10-07 NOTE — Telephone Encounter (Signed)
Pt wanted to make sure Michelle Patrick was aware that she is out of this medication. ?

## 2021-12-03 ENCOUNTER — Encounter: Payer: Self-pay | Admitting: Family Medicine

## 2021-12-03 ENCOUNTER — Ambulatory Visit (INDEPENDENT_AMBULATORY_CARE_PROVIDER_SITE_OTHER): Payer: BC Managed Care – PPO | Admitting: Family Medicine

## 2021-12-03 VITALS — BP 130/80 | HR 85 | Temp 98.5°F | Ht 62.0 in | Wt 124.4 lb

## 2021-12-03 DIAGNOSIS — F411 Generalized anxiety disorder: Secondary | ICD-10-CM

## 2021-12-03 DIAGNOSIS — Z13220 Encounter for screening for lipoid disorders: Secondary | ICD-10-CM | POA: Diagnosis not present

## 2021-12-03 DIAGNOSIS — F321 Major depressive disorder, single episode, moderate: Secondary | ICD-10-CM

## 2021-12-03 DIAGNOSIS — R519 Headache, unspecified: Secondary | ICD-10-CM

## 2021-12-03 DIAGNOSIS — Z0000 Encounter for general adult medical examination without abnormal findings: Secondary | ICD-10-CM

## 2021-12-03 LAB — COMPREHENSIVE METABOLIC PANEL
ALT: 13 U/L (ref 0–35)
AST: 17 U/L (ref 0–37)
Albumin: 4.2 g/dL (ref 3.5–5.2)
Alkaline Phosphatase: 28 U/L — ABNORMAL LOW (ref 39–117)
BUN: 14 mg/dL (ref 6–23)
CO2: 25 mEq/L (ref 19–32)
Calcium: 9.2 mg/dL (ref 8.4–10.5)
Chloride: 104 mEq/L (ref 96–112)
Creatinine, Ser: 0.98 mg/dL (ref 0.40–1.20)
GFR: 76.99 mL/min (ref >60.00)
Glucose, Bld: 90 mg/dL (ref 70–99)
Potassium: 3.9 mEq/L (ref 3.5–5.1)
Sodium: 135 mEq/L (ref 135–145)
Total Bilirubin: 0.5 mg/dL (ref 0.2–1.2)
Total Protein: 6.9 g/dL (ref 6.0–8.3)

## 2021-12-03 LAB — CBC WITH DIFFERENTIAL/PLATELET
Basophils Absolute: 0 10*3/uL (ref 0.0–0.1)
Basophils Relative: 0.5 % (ref 0.0–3.0)
Eosinophils Absolute: 0.2 10*3/uL (ref 0.0–0.7)
Eosinophils Relative: 1.6 % (ref 0.0–5.0)
HCT: 42 % (ref 36.0–46.0)
Hemoglobin: 14.1 g/dL (ref 12.0–15.0)
Lymphocytes Relative: 26.1 % (ref 12.0–46.0)
Lymphs Abs: 2.6 10*3/uL (ref 0.7–4.0)
MCHC: 33.6 g/dL (ref 30.0–36.0)
MCV: 94.1 fl (ref 78.0–100.0)
Monocytes Absolute: 0.5 10*3/uL (ref 0.1–1.0)
Monocytes Relative: 5.6 % (ref 3.0–12.0)
Neutro Abs: 6.5 10*3/uL (ref 1.4–7.7)
Neutrophils Relative %: 66.2 % (ref 43.0–77.0)
Platelets: 292 10*3/uL (ref 150.0–400.0)
RBC: 4.46 Mil/uL (ref 3.87–5.11)
RDW: 13 % (ref 11.5–15.5)
WBC: 9.8 10*3/uL (ref 4.0–10.5)

## 2021-12-03 LAB — LIPID PANEL
Cholesterol: 167 mg/dL (ref 0–200)
HDL: 73.3 mg/dL (ref >39.00)
LDL Cholesterol: 81 mg/dL (ref 0–99)
NonHDL: 93.49
Total CHOL/HDL Ratio: 2
Triglycerides: 62 mg/dL (ref 0.0–149.0)
VLDL: 12.4 mg/dL (ref 0.0–40.0)

## 2021-12-03 LAB — TSH: TSH: 1.57 u[IU]/mL (ref 0.35–5.50)

## 2021-12-03 NOTE — Patient Instructions (Addendum)
Depression/anxiety less than ideal control but improving- I think going up to 50 mg is reasonable on sertraline- she is going to consider reaching out to GYN for long term increase or I am happy to do so as well- she wants to think this over. She can call or send mychart- does not need a charge for this at that time since already discussed.  ?-consider therapist ? ?-any thoughts of self harm asked her to call us, [phone] or [phone] immediately ? ?Team please draw labs in the room- tendency towards feeling lightheaded with blood draws ?If you have mychart- we will send your results within 3 business days of [place] receiving them.  ?If you do not have mychart- we will call you about results within 5 business days of [place] receiving them.  ?*please also note that you will see labs on mychart as soon as they post. I will later go in and write notes on them- will say "notes from Dr. Yong Channel"  ? ?Recommended follow up: Return in about 1 year (around 12/04/2022) for physical or sooner if needed.Schedule b4 you leave. ?-for instance 3-6 months to discuss anxiety if needed ?

## 2021-12-03 NOTE — Progress Notes (Signed)
?Phone [phone] ?  ?Subjective:  ?Patient presents today for their annual physical. Chief complaint-noted.  ? ?See problem oriented charting- ?ROS- full  review of systems was completed and negative ?except for: fatigue, headaches, sad mood, anxiety, sleep issues ? ?The following were reviewed and entered/updated in epic: ?Past Medical History:  ?Diagnosis Date  ? Anxiety   ? Dysmenorrhea   ? on ocps   ? Generalized headaches   ? has seen Dr Gaynell Face  now dr Jalene Mullet  ? Recurrent UTI   ? ?Patient Active Problem List  ? Diagnosis Date Noted  ? Depression, major, single episode, moderate ([place]) 06/18/2017  ?  Priority: High  ? Generalized headaches   ?  Priority: Medium   ? Anxiety disorder 11/08/2011  ?  Priority: Medium   ? 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  Priority: Medium   ? Oral contraceptive use 11/08/2011  ?  Priority: Low  ? 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  Priority: Low  ? UTI'S, RECURRENT 06/16/2007  ?  Priority: Low  ? ?Past Surgical History:  ?Procedure Laterality Date  ? none    ? ? ?Family History  ?Problem Relation Age of Onset  ? Other Mother   ?     ulcerative proctosigmoiditis  ? Diabetes Mother   ? Hyperlipidemia Mother   ? Hypertension Mother   ? Anxiety disorder Mother   ?     GAD  ? Diabetes Father   ? Hypertension Father   ? Hyperlipidemia Father   ? Colon polyps Father   ?     adenoma  ? ? ?Medications- reviewed and updated ?Current Outpatient Medications  ?Medication Sig Dispense Refill  ? aspirin-acetaminophen-caffeine (EXCEDRIN MIGRAINE) 250-250-65 MG tablet Take 1 tablet by mouth every 6 (six) hours as needed for headache.    ? BLISOVI FE 1.5/30 1.5-30 MG-MCG tablet TAKE 1 TABLET BY MOUTH DAILY 84 tablet 3  ? naratriptan (AMERGE) 2.5 MG tablet Take 2.5 mg by mouth as needed for migraine.     ? valACYclovir (VALTREX) 1000 MG tablet TAKE 2 TABLETS BY MOUTH TWICE DAILY FOR 1 DAY AT FIRST SIGN OF COLD SORE 60 tablet 2  ? ?No current facility-administered medications for this  visit.  ? ? ?Allergies-reviewed and updated ?No Known Allergies ? ?Social History  ? ?Social History Narrative  ? Married may 2021. Engaged Nov 12, 2018.  ? 1 dog (cats passed away x 2)   ?   ?  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 Went to [place] [employer] for college  ([employer])  ?   ? Hobbies: enjoys going to the lake, sleep  ? ?Objective  ?Objective:  ?BP 130/80   Pulse 85   Temp 98.5 ?F (36.9 ?C)   Ht '5\' 2"'$  (1.575 m)   Wt 124 lb 6.4 oz (56.4 kg)   SpO2 99%   BMI 22.75 kg/m?  ?Gen: NAD, resting comfortably ?HEENT: Mucous membranes are moist. Oropharynx normal ?Neck: no thyromegaly ?CV: RRR no murmurs rubs or gallops ?Lungs: CTAB no crackles, wheeze, rhonchi ?Abdomen: soft/nontender/nondistended/normal bowel sounds. No rebound or guarding.  ?Ext: no edema ?Skin: warm, dry ?Neuro: grossly normal, moves all extremities, PERRLA ?  ?Assessment and Plan  ? ?31 y.o. female presenting for annual physical.  ?Health Maintenance counseling: ?1. Anticipatory guidance: Patient counseled regarding regular dental exams - advised q6 months, eye exams - saw eye doctor in last year- astigmatism noted,  avoiding smoking and second hand smoke , limiting  alcohol to 1 beverage per day, no illicit drugs .   ?2. Risk factor reduction:  Advised patient of need for regular exercise and diet rich and fruits and vegetables to reduce risk of heart attack and stroke.  ?Exercise- encouraged to restart- goal 150 minutes a week.  ?Diet/weight management- Has maintained a healthy weight ?Wt Readings from Last 3 Encounters:  ?12/03/21 124 lb 6.4 oz (56.4 kg)  ?08/22/20 120 lb (54.4 kg)  ?01/17/20 118 lb (53.5 kg)  ?3. Immunizations/screenings/ancillary studies- had a covid vaccine in distant past- wants to hold off on repeat ?Immunization History  ?Administered Date(s) Administered  ? Hpv-Unspecified 04/28/2006, 06/29/2006, 10/29/2006  ? Influenza Split 05/16/2012  ? Influenza Whole 05/09/2009  ? Influenza,inj,Quad PF,6+ Mos  07/30/2014  ? Meningococcal Polysaccharide 05/09/2009  ? PPD Test 11/13/2011  ? Tdap 11/13/2011, 10/17/2018  ? 4. Cervical cancer screening- seeing GYN- pap 07/19/20 with negative HPV- possible 5 year follow up  ?5. Breast cancer screening-  breast exam with GYN and mammogram - would start yearly at age 72 ?6. Colon cancer screening - no family history, start at age 20 ?67. Skin cancer screening- no dermatatologist. advised regular sunscreen use. Denies worrisome, changing, or new skin lesions.  ?8. Birth control/STD check- on Blisovi- prevention only.  Monogamous so no STD screening ?9. Osteoporosis screening at 89- would plan on this after menopause ?10. Smoking associated screening - never smoker ? ?Status of chronic or acute concerns  ? ?#Migraines/headaches-on Amerge or Excedrin and follows with neurology-reports taking excedrin 4 days a week and amerge 5 pills within issue- discussed risk of rebound headaches taking more than twice a week  ? ?# Anxiety/depression ?S:Medication: Sertraline 25 mg and has gone up to 50 mg within a week(was on Celexa 40 mg and buspirone at last visit and we were initially going to try Pristiq 50 mg - she states did not help-and increase buspirone- she stopped this) ?Counseling: Has not tried in the past ?-Is considering pregnancy within a year - thus GYN had switched to sertraline ? ?  12/03/2021  ? 11:03 AM 03/13/2021  ?  2:14 PM 08/22/2020  ? 10:06 AM  ?Depression screen PHQ 2/9  ?Decreased Interest '2 3 1  '$ ?Down, Depressed, Hopeless '2 3 2  '$ ?PHQ - 2 Score '4 6 3  '$ ?Altered sleeping '2 3 1  '$ ?Tired, decreased energy '2 2 1  '$ ?Change in appetite 0  0  ?Feeling bad or failure about yourself  2 0 1  ?Trouble concentrating 0 2 3  ?Moving slowly or fidgety/restless 0 0 0  ?Suicidal thoughts 0 0 0  ?PHQ-9 Score '10 13 9  '$ ?Difficult doing work/chores Somewhat difficult Very difficult Somewhat difficult  ?A/P: less than ideal control but improved-  I think going up to 50 mg is reasonable on  sertraline- she is going to consider reaching out to GYN for long term increase or I am happy to do so as well- she wants to think this over. She can call or send mychart- does not need a charge for this at that time since already discussed.  ?-strongly encouraged seeing a therapist- she seems slightly more open ?-any thoughts of self harm asked her to call us, 911 or [phone] immediately ? ?#Cold sores-has Valtrex on hand if needed ? ?#BP slightly high- better on repeat ? ?#Screening hyperlipidemia-last lipids done in 2013-he agrees to repeat at this time ?Lab Results  ?Component Value Date  ? CHOL 173 11/04/2011  ? HDL 67.70 11/04/2011  ?  [place] 82 11/04/2011  ? LDLDIRECT 88.0 07/19/2017  ? TRIG 115.0 11/04/2011  ? CHOLHDL 3 11/04/2011  ? ?Recommended follow up: Return in about 1 year (around 12/04/2022) for physical or sooner if needed.Schedule b4 you leave. ? ?Lab/Order associations: fasting ?  ICD-10-CM   ?1. Preventative health care  Z00.00 CBC with Differential/Platelet  ?  Comprehensive metabolic panel  ?  Lipid panel  ?  TSH  ?  ?2. Generalized anxiety disorder  F41.1 CBC with Differential/Platelet  ?  Comprehensive metabolic panel  ?  TSH  ?  ?3. Generalized headaches  R51.9   ?  ?4. Screening for hyperlipidemia  Z13.220 Lipid panel  ?  ? ? ? ?No orders of the defined types were placed in this encounter. ? ? ?Return precautions advised.  ?Garret Reddish, MD ? ? ?

## 2021-12-05 ENCOUNTER — Other Ambulatory Visit: Payer: Self-pay

## 2021-12-08 ENCOUNTER — Telehealth: Payer: Self-pay

## 2021-12-08 ENCOUNTER — Other Ambulatory Visit: Payer: Self-pay

## 2021-12-08 MED ORDER — SERTRALINE HCL 50 MG PO TABS: 50.0000 mg | ORAL_TABLET | Freq: Every day | ORAL | 3 refills | Status: DC

## 2021-12-08 NOTE — Telephone Encounter (Signed)
Patient states she seen [facility] for CPE on 5/3.  States Dr. Yong Channel instructed her to call into the office to request Zoloft 50 mg to be sent to [facility].    Patient is requesting a call back in regard to possible side effects from zoloft.    States she has been having some jaw pain that runs up back of neck and into jaw.  Would like to know if this is caused by zoloft.

## 2021-12-08 NOTE — Telephone Encounter (Signed)
Noted  

## 2021-12-08 NOTE — Telephone Encounter (Signed)
Please schedule pt OV to be evaluated.  ?

## 2021-12-08 NOTE — Telephone Encounter (Signed)
Patient stated she cannot come in office as she cannot afford visit at the moment - patient stated she will call back should she need to come in office.  ?

## 2021-12-08 NOTE — Telephone Encounter (Signed)
See below

## 2021-12-08 NOTE — Telephone Encounter (Signed)
That is not a common side effect from Zoloft-recommend she be evaluated in office for those symptoms. She was already taking the '50mg'$  dose at last visit from what I understood ?

## 2022-01-12 ENCOUNTER — Encounter: Payer: BC Managed Care – PPO | Admitting: Family Medicine

## 2022-04-27 ENCOUNTER — Encounter: Payer: Self-pay | Admitting: *Deleted

## 2022-05-25 ENCOUNTER — Other Ambulatory Visit: Payer: Self-pay | Admitting: Family Medicine

## 2022-07-16 ENCOUNTER — Encounter: Payer: Self-pay | Admitting: *Deleted

## 2022-08-14 ENCOUNTER — Telehealth: Payer: Self-pay | Admitting: Family Medicine

## 2022-08-14 NOTE — Telephone Encounter (Signed)
  Encourage patient to contact the pharmacy for refills or they can request refills through [facility]:  Please schedule appointment if longer than 1 year  NEXT APPOINTMENT DATE:  MEDICATION:  BLISOVI FE 1.5/30 1.5-30 MG-MCG tablet   Is the patient out of medication? YES  PHARMACY:  [facility] - [place], [place] Phone: [phone]  Fax: [phone]      Let patient know to contact pharmacy at the end of the day to make sure medication is ready.  Please notify patient to allow 48-72 hours to process

## 2022-08-18 MED ORDER — NORETHIN ACE-ETH ESTRAD-FE 1.5-30 MG-MCG PO TABS: 1.0000 | ORAL_TABLET | Freq: Every day | ORAL | 3 refills | Status: DC

## 2022-08-18 NOTE — Telephone Encounter (Signed)
Rx sent to pharmacy.

## 2022-12-07 ENCOUNTER — Encounter: Payer: BC Managed Care – PPO | Admitting: Family Medicine

## 2023-05-14 ENCOUNTER — Other Ambulatory Visit: Payer: Self-pay | Admitting: Family Medicine

## 2023-05-31 ENCOUNTER — Encounter: Payer: Self-pay | Admitting: Internal Medicine

## 2023-05-31 ENCOUNTER — Ambulatory Visit: Payer: BC Managed Care – PPO | Admitting: Internal Medicine

## 2023-05-31 VITALS — BP 130/85 | HR 67 | Temp 97.6°F | Ht 62.0 in | Wt 124.6 lb

## 2023-05-31 DIAGNOSIS — Z3169 Encounter for other general counseling and advice on procreation: Secondary | ICD-10-CM | POA: Diagnosis not present

## 2023-05-31 DIAGNOSIS — F32A Depression, unspecified: Secondary | ICD-10-CM | POA: Diagnosis not present

## 2023-05-31 MED ORDER — SERTRALINE HCL 50 MG PO TABS: 50.0000 mg | ORAL_TABLET | Freq: Every day | ORAL | 3 refills | Status: DC
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
## 2023-05-31 NOTE — Patient Instructions (Signed)
VISIT SUMMARY:  Today, we discussed your current health status and addressed your concerns regarding depression, prediabetes, and preconception planning. We reviewed your medications and lifestyle habits to ensure you are on the right track for managing your health and preparing for a potential pregnancy.  YOUR PLAN:  -DEPRESSION: Depression is a mood disorder characterized by persistent feelings of sadness and loss of interest. We will continue your current dose of Zoloft, as it is stable and safe during pregnancy. If you become pregnant, we will consider gradually tapering off the medication while closely monitoring your symptoms.  -PREDIABETES: Prediabetes is a condition where blood sugar levels are higher than normal but not high enough to be classified as diabetes. We discussed the importance of diet and exercise in managing this condition. You should continue with your current lifestyle modifications, focusing on a low-carb, high-protein diet.  -PRECONCEPTION COUNSELING: Preconception counseling involves preparing your body for a healthy pregnancy. We discussed starting prenatal vitamins, managing your prediabetes, and avoiding alcohol. We also talked about the potential risks of Zoloft during pregnancy and will consider thyroid testing and other preconception blood work at your next wellness visit.  -MEDICATION REFILL: We have sent a refill for your Zoloft prescription to [facility]. Please continue taking it as prescribed.  INSTRUCTIONS:  Please start taking prenatal vitamins and avoid alcohol. Continue with your current dose of Zoloft and follow a low-carb, high-protein diet to manage your prediabetes. We will consider thyroid testing and other preconception blood work at your next wellness visit. If you become pregnant, contact us to discuss the possibility of tapering off Zoloft.

## 2023-07-13 ENCOUNTER — Other Ambulatory Visit: Payer: Self-pay | Admitting: Obstetrics and Gynecology

## 2023-07-13 DIAGNOSIS — R928 Other abnormal and inconclusive findings on diagnostic imaging of breast: Secondary | ICD-10-CM

## 2023-07-13 DIAGNOSIS — N63 Unspecified lump in unspecified breast: Secondary | ICD-10-CM

## 2023-08-11 ENCOUNTER — Other Ambulatory Visit: Payer: Self-pay | Admitting: Obstetrics and Gynecology

## 2023-08-11 ENCOUNTER — Ambulatory Visit
Admission: RE | Admit: 2023-08-11 | Discharge: 2023-08-11 | Disposition: A | Discharge status: Home / Self Care | Payer: BC Managed Care – PPO | Source: Ambulatory Visit | Attending: Obstetrics and Gynecology | Admitting: Obstetrics and Gynecology

## 2023-08-11 DIAGNOSIS — R928 Other abnormal and inconclusive findings on diagnostic imaging of breast: Secondary | ICD-10-CM

## 2023-08-11 DIAGNOSIS — N6489 Other specified disorders of breast: Secondary | ICD-10-CM

## 2023-08-11 DIAGNOSIS — N63 Unspecified lump in unspecified breast: Secondary | ICD-10-CM

## 2023-08-11 DIAGNOSIS — N6310 Unspecified lump in the right breast, unspecified quadrant: Secondary | ICD-10-CM

## 2023-08-11 DIAGNOSIS — N6320 Unspecified lump in the left breast, unspecified quadrant: Secondary | ICD-10-CM

## 2023-11-24 ENCOUNTER — Encounter: Payer: BC Managed Care – PPO | Admitting: Family Medicine

## 2024-02-10 ENCOUNTER — Ambulatory Visit
Admission: RE | Admit: 2024-02-10 | Discharge: 2024-02-10 | Disposition: A | Discharge status: Home / Self Care | Source: Ambulatory Visit | Attending: Obstetrics and Gynecology | Admitting: Obstetrics and Gynecology

## 2024-02-10 ENCOUNTER — Other Ambulatory Visit: Payer: Self-pay | Admitting: Obstetrics and Gynecology

## 2024-02-10 DIAGNOSIS — N6320 Unspecified lump in the left breast, unspecified quadrant: Secondary | ICD-10-CM

## 2024-02-10 DIAGNOSIS — N6489 Other specified disorders of breast: Secondary | ICD-10-CM

## 2024-02-10 DIAGNOSIS — N6310 Unspecified lump in the right breast, unspecified quadrant: Secondary | ICD-10-CM

## 2024-04-04 ENCOUNTER — Encounter: Payer: Self-pay | Admitting: Family Medicine

## 2024-04-04 ENCOUNTER — Ambulatory Visit: Payer: Self-pay | Admitting: Family Medicine

## 2024-04-04 ENCOUNTER — Ambulatory Visit (INDEPENDENT_AMBULATORY_CARE_PROVIDER_SITE_OTHER): Admitting: Family Medicine

## 2024-04-04 VITALS — BP 118/72 | HR 91 | Temp 98.1°F | Ht 62.0 in | Wt 123.8 lb

## 2024-04-04 DIAGNOSIS — F32A Depression, unspecified: Secondary | ICD-10-CM | POA: Diagnosis not present

## 2024-04-04 DIAGNOSIS — Z0001 Encounter for general adult medical examination with abnormal findings: Secondary | ICD-10-CM | POA: Diagnosis not present

## 2024-04-04 DIAGNOSIS — R5383 Other fatigue: Secondary | ICD-10-CM

## 2024-04-04 DIAGNOSIS — Z130 Encounter for screening for diseases of the blood and blood-forming organs and certain disorders involving the immune mechanism: Secondary | ICD-10-CM

## 2024-04-04 DIAGNOSIS — Z13220 Encounter for screening for lipoid disorders: Secondary | ICD-10-CM

## 2024-04-04 DIAGNOSIS — Z0000 Encounter for general adult medical examination without abnormal findings: Secondary | ICD-10-CM

## 2024-04-04 LAB — COMPREHENSIVE METABOLIC PANEL WITH GFR
ALT: 13 U/L (ref 0–35)
AST: 22 U/L (ref 0–37)
Albumin: 4.5 g/dL (ref 3.5–5.2)
Alkaline Phosphatase: 41 U/L (ref 39–117)
BUN: 16 mg/dL (ref 6–23)
CO2: 28 meq/L (ref 19–32)
Calcium: 9.7 mg/dL (ref 8.4–10.5)
Chloride: 103 meq/L (ref 96–112)
Creatinine, Ser: 0.98 mg/dL (ref 0.40–1.20)
GFR: 75.73 mL/min (ref >60.00)
Glucose, Bld: 85 mg/dL (ref 70–99)
Potassium: 3.9 meq/L (ref 3.5–5.1)
Sodium: 139 meq/L (ref 135–145)
Total Bilirubin: 0.6 mg/dL (ref 0.2–1.2)
Total Protein: 7.4 g/dL (ref 6.0–8.3)

## 2024-04-04 LAB — CBC WITH DIFFERENTIAL/PLATELET
Basophils Absolute: 0 K/uL (ref 0.0–0.1)
Basophils Relative: 0.5 % (ref 0.0–3.0)
Eosinophils Absolute: 0.1 K/uL (ref 0.0–0.7)
Eosinophils Relative: 1.1 % (ref 0.0–5.0)
HCT: 45.1 % (ref 36.0–46.0)
Hemoglobin: 15 g/dL (ref 12.0–15.0)
Lymphocytes Relative: 30.1 % (ref 12.0–46.0)
Lymphs Abs: 2.3 K/uL (ref 0.7–4.0)
MCHC: 33.2 g/dL (ref 30.0–36.0)
MCV: 96.5 fl (ref 78.0–100.0)
Monocytes Absolute: 0.9 K/uL (ref 0.1–1.0)
Monocytes Relative: 11.9 % (ref 3.0–12.0)
Neutro Abs: 4.3 K/uL (ref 1.4–7.7)
Neutrophils Relative %: 56.4 % (ref 43.0–77.0)
Platelets: 279 K/uL (ref 150.0–400.0)
RBC: 4.67 Mil/uL (ref 3.87–5.11)
RDW: 13.6 % (ref 11.5–15.5)
WBC: 7.7 K/uL (ref 4.0–10.5)

## 2024-04-04 LAB — TSH: TSH: 1.15 u[IU]/mL (ref 0.35–5.50)

## 2024-04-04 MED ORDER — SERTRALINE HCL 50 MG PO TABS: 50.0000 mg | ORAL_TABLET | Freq: Every day | ORAL | 3 refills | Status: AC

## 2024-04-04 MED ORDER — VALACYCLOVIR HCL 1 G PO TABS: ORAL_TABLET | ORAL | 2 refills | Status: AC

## 2024-04-04 NOTE — Patient Instructions (Addendum)
 Please stop by lab before you go If you have mychart- we will send your results within 3 business days of us  receiving them.  If you do not have mychart- we will call you about results within 5 business days of us  receiving them.  *please also note that you will see labs on mychart as soon as they post. I will later go in and write notes on them- will say notes from Dr. Katrinka   Since you have some interest in coming off of sertraline - would be reasonable to pursue therapy to see if that could help as an alternate  Recommended follow up: Return in about 1 year (around 04/04/2025) for physical or sooner if needed.Schedule b4 you leave.

## 2024-04-04 NOTE — Progress Notes (Signed)
 Phone [phone]   Subjective:  Patient presents today for their annual physical. Chief complaint-noted.   See problem oriented charting- ROS- full  review of systems was completed and negative except for topics noted under acute/chronic concerns   The following were reviewed and entered/updated in epic: Past Medical History:  Diagnosis Date   Anxiety    Dysmenorrhea    on ocps    Generalized headaches    has seen Dr Susen  now dr Homer   Recurrent UTI    Patient Active Problem List   Diagnosis Date Noted   Depression, major, single episode, moderate (HCC) 06/18/2017    Priority: High   Generalized headaches     Priority: Medium    Anxiety disorder 11/08/2011    Priority: Medium    Herpes labialis/cold sores 11/08/2011    Priority: Medium    Oral contraceptive use 11/08/2011    Priority: Low   FIBROADENOMA, BREAST 12/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    Priority: Low   UTI'S, RECURRENT 06/16/2007    Priority: Low   Past Surgical History:  Procedure Laterality Date   none      Family History  Problem Relation Age of Onset   Other Mother        ulcerative proctosigmoiditis   Diabetes Mother    Hyperlipidemia Mother    Hypertension Mother    Anxiety disorder Mother        GAD   Diabetes Father    Hypertension Father    Hyperlipidemia Father    Colon polyps Father        adenoma    Medications- reviewed and updated Current Outpatient Medications  Medication Sig Dispense Refill   aspirin-acetaminophen -caffeine (EXCEDRIN MIGRAINE) 250-250-65 MG tablet Take 1 tablet by mouth every 6 (six) hours as needed for headache.     naratriptan (AMERGE) 2.5 MG tablet Take 2.5 mg by mouth as needed for migraine.      sertraline  (ZOLOFT ) 50 MG tablet Take 1 tablet (50 mg total) by mouth daily. 90 tablet 3   valACYclovir  (VALTREX ) 1000 MG tablet TAKE 2 TABLETS BY MOUTH TWICE DAILY FOR 1 DAY AT FIRST SIGN OF COLD SORE 28 tablet 2   No current facility-administered medications for this  visit.   Allergies-reviewed and updated No Known Allergies  Social History   Social History Narrative   Married may 2021. Engaged 04/12/19.   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 Oropharynx normal Neck: no thyromegaly CV: RRR no murmurs rubs or gallops Lungs: CTAB no crackles, wheeze, rhonchi Abdomen: soft/nontender/nondistended/normal bowel sounds. No rebound or guarding.  Ext: no edema Skin: warm, dry Neuro: grossly normal, moves all extremities, PERRLA   Assessment and Plan   33 y.o. female presenting for annual physical.  Health Maintenance counseling: 1. Anticipatory guidance: Patient counseled regarding regular dental exams -q6 months,  eye exams has astigmatism- as needed only,  avoiding smoking and second hand smoke , limiting alcohol to 1 beverage per day- 4 a week , no illicit drugs .   2. Risk factor reduction:  Advised patient of need for regular exercise and diet rich and fruits and vegetables to reduce risk of heart attack and stroke.  Exercise- 2 days a week at gym- [employer].  Diet/weight management-has maintained a healthy weight-eats reasonably healthy diet- could eat more consistently  Wt Readings from Last 3 Encounters:  04/04/24 123 lb 12.8 oz (56.2 kg)  05/31/23 124 lb 9.6 oz (56.5 kg)  12/03/21 124 lb 6.4 oz (56.4 kg)  3. Immunizations/screenings/ancillary studies-declines COVID shot.  Consider fall flu shot which we do not have  available yet.  Already had Gardasil.  Hepatitis B vaccine in childhood Immunization History  Administered Date(s) Administered   Hpv-Unspecified 04/28/2006, 06/29/2006, 10/29/2006   Influenza Split 05/16/2012   Influenza Whole 05/09/2009   Influenza,inj,Quad PF,6+ Mos 07/30/2014   Meningococcal polysaccharide vaccine (MPSV4) 05/09/2009   PPD Test 11/13/2011   Tdap 11/13/2011, 10/17/2018  4. Cervical cancer screening- follows with GYN-Pap 07/19/2020 with negative HPV-likely 5-year repeat 5. Breast cancer screening-  breast exam with GYN and mammogram -annual starting at age 58- but did have to get early mammogram for mass that was ok 6. Colon cancer screening - no family history, start at age 82  7. Skin cancer screening- within last year- doesn't recall name-advised regular sunscreen use. Denies worrisome, changing, or new skin lesions.  8. Birth control/STD check- actively trying- if happens it happens, on [place] previously.  Monogamous no STD screening 9. Osteoporosis screening at 65-will plan on after menopause 10. Smoking associated screening -   smoker  Status of chronic or acute concerns   # Migraine/headaches-on Amerge or Excedrin and follows with neurology- improved down to 4-6 x a month total. Wants to monitor- thinks managing stress better   #prediabetes - was placed on metformin after prior miscarriage but later taken off when #s improved- off for 1 month but just had a1c done  # Depression S: Medication:Prior sertraline  50 mg-has been changed to this by GYN as well as considering pregnancy - Buspirone  not helpful in the past - Citalopram  40 mg - As of last visit had not tried therapy  A/P: imperfect control- phq9 and General Anxiety Disorder - 7 question screening survey over 10- she reports feels managing reasonably well- wants to hold off on adjustments as doing reasonably well in life and no SI- if any worsening can reach out . No SI -refill sertraline  50 mg- she is  aware if becomes pregnant some risks -Since you have some interest in coming off of sertraline - would be reasonable to pursue therapy to see if that could help as an alternate  # Neurosurgical consult-seen in June by Dr. Scharlene with [facility]-recommended home exercise program and physical therapy as well as follow-up with PCP.  Mainly went as has pinpoint area - did not think she needed more workup  # Cold sores-Valtrex  on hand if needed  #some fatigue- wants to update labs but also generally doesn't eat until lunch at least and possible that contributes to fatigue  # Screening hyperlipidemia S: Medication: None Lab Results  Component Value Date   CHOL 167 12/03/2021   HDL 73.30 12/03/2021   LDLCALC 81 12/03/2021   LDLDIRECT 88.0 07/19/2017   TRIG 62.0 12/03/2021   CHOLHDL 2 12/03/2021   A/P: update lipids but  low risk previously   Recommended follow up: Return in about 1 year (around 04/04/2025) for physical or sooner if needed.Schedule b4 you leave. Future Appointments  Date Time Provider Department Center  08/14/2024 10:40 AM [facility] 1 [facility]  08/14/2024 10:50 AM [facility] US  1 [facility]  08/14/2024 10:55 AM [facility] US  1 [facility]   Lab/Order associations: fasting   ICD-10-CM   1. Preventative health care  Z00.00     2. Depression, unspecified depression type  F32.A sertraline  (ZOLOFT ) 50 MG tablet    3. Screening for hyperlipidemia  Z13.220 Comp Met (CMET)    Lipid panel    4. Fatigue, unspecified type  R53.83 CBC w/Diff    Comp Met (CMET)    TSH    5. Screening, anemia, deficiency, iron  Z13.0 CBC w/Diff      Meds ordered this encounter  Medications   valACYclovir  (VALTREX ) 1000 MG tablet    Sig: TAKE 2 TABLETS BY MOUTH TWICE DAILY FOR 1 DAY AT FIRST SIGN OF COLD SORE    Dispense:  28 tablet    Refill:  2   sertraline  (ZOLOFT ) 50 MG tablet    Sig: Take 1 tablet (50 mg total) by mouth daily.    Dispense:  90  tablet    Refill:  3    Return precautions advised.  Garnette Lukes, MD

## 2024-04-05 LAB — LIPID PANEL
Cholesterol: 190 mg/dL (ref <200)
HDL: 106 mg/dL (ref >=50)
LDL Cholesterol (Calc): 72 mg/dL
Non-HDL Cholesterol (Calc): 84 mg/dL (ref <130)
Total CHOL/HDL Ratio: 1.8 (calc) (ref <5.0)
Triglycerides: 42 mg/dL (ref <150)

## 2024-08-14 ENCOUNTER — Other Ambulatory Visit

## 2024-08-14 ENCOUNTER — Encounter
